# Patient Record
Sex: Female | Born: 1995
Health system: Southern US, Community
[De-identification: ages and names within clinical notes are randomized; demographics above are authoritative.]

## PROBLEM LIST (undated history)

## (undated) VITALS — BP 104/72 | HR 109 | Temp 98.5°F | Resp 16 | Ht 64.17 in | Wt 121.0 lb

## (undated) DIAGNOSIS — F32A Depression, unspecified: Secondary | ICD-10-CM

## (undated) DIAGNOSIS — F419 Anxiety disorder, unspecified: Secondary | ICD-10-CM

## (undated) DIAGNOSIS — F329 Major depressive disorder, single episode, unspecified: Secondary | ICD-10-CM

## (undated) HISTORY — DX: Major depressive disorder, single episode, unspecified: F32.9

## (undated) HISTORY — DX: Depression, unspecified: F32.A

## (undated) HISTORY — DX: Anxiety disorder, unspecified: F41.9

---
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
## 2016-10-20 DIAGNOSIS — L910 Hypertrophic scar: Secondary | ICD-10-CM | POA: Diagnosis not present

## 2016-10-20 DIAGNOSIS — L709 Acne, unspecified: Secondary | ICD-10-CM | POA: Diagnosis not present

## 2017-01-27 DIAGNOSIS — F419 Anxiety disorder, unspecified: Secondary | ICD-10-CM | POA: Diagnosis not present

## 2017-02-08 ENCOUNTER — Encounter (HOSPITAL_COMMUNITY): Payer: Self-pay

## 2017-02-08 ENCOUNTER — Emergency Department (HOSPITAL_COMMUNITY)
Admission: EM | Admit: 2017-02-08 | Discharge: 2017-02-08 | Disposition: A | Discharge status: Home / Self Care | Payer: BLUE CROSS/BLUE SHIELD | Attending: Emergency Medicine | Admitting: Emergency Medicine

## 2017-02-08 DIAGNOSIS — F1120 Opioid dependence, uncomplicated: Secondary | ICD-10-CM | POA: Insufficient documentation

## 2017-02-08 DIAGNOSIS — Z79899 Other long term (current) drug therapy: Secondary | ICD-10-CM | POA: Diagnosis not present

## 2017-02-08 DIAGNOSIS — F331 Major depressive disorder, recurrent, moderate: Secondary | ICD-10-CM | POA: Insufficient documentation

## 2017-02-08 DIAGNOSIS — T43292A Poisoning by other antidepressants, intentional self-harm, initial encounter: Secondary | ICD-10-CM | POA: Diagnosis not present

## 2017-02-08 DIAGNOSIS — T424X2A Poisoning by benzodiazepines, intentional self-harm, initial encounter: Secondary | ICD-10-CM | POA: Diagnosis not present

## 2017-02-08 DIAGNOSIS — T50902A Poisoning by unspecified drugs, medicaments and biological substances, intentional self-harm, initial encounter: Secondary | ICD-10-CM

## 2017-02-08 DIAGNOSIS — T43222A Poisoning by selective serotonin reuptake inhibitors, intentional self-harm, initial encounter: Secondary | ICD-10-CM | POA: Diagnosis not present

## 2017-02-08 DIAGNOSIS — T50992A Poisoning by other drugs, medicaments and biological substances, intentional self-harm, initial encounter: Secondary | ICD-10-CM | POA: Diagnosis not present

## 2017-02-08 LAB — COMPREHENSIVE METABOLIC PANEL
ALK PHOS: 136 U/L — AB (ref 38–126)
ALT: 13 U/L — ABNORMAL LOW (ref 14–54)
ANION GAP: 11 (ref 5–15)
AST: 23 U/L (ref 15–41)
Albumin: 4.6 g/dL (ref 3.5–5.0)
BUN: 15 mg/dL (ref 6–20)
CALCIUM: 10 mg/dL (ref 8.9–10.3)
CO2: 22 mmol/L (ref 22–32)
Chloride: 106 mmol/L (ref 101–111)
Creatinine, Ser: 0.87 mg/dL (ref 0.44–1.00)
GFR calc Af Amer: >60 mL/min (ref >60)
GFR calc non Af Amer: >60 mL/min (ref >60)
Glucose, Bld: 113 mg/dL — ABNORMAL HIGH (ref 65–99)
POTASSIUM: 3.5 mmol/L (ref 3.5–5.1)
Sodium: 139 mmol/L (ref 135–145)
Total Bilirubin: 0.9 mg/dL (ref 0.3–1.2)
Total Protein: 8.2 g/dL — ABNORMAL HIGH (ref 6.5–8.1)

## 2017-02-08 LAB — RAPID URINE DRUG SCREEN, HOSP PERFORMED
Amphetamines: NOT DETECTED
Barbiturates: NOT DETECTED
Benzodiazepines: POSITIVE — AB
COCAINE: NOT DETECTED
Opiates: POSITIVE — AB
Tetrahydrocannabinol: NOT DETECTED

## 2017-02-08 LAB — ETHANOL: Alcohol, Ethyl (B): <5 mg/dL (ref <5)

## 2017-02-08 LAB — CBC
HCT: 43.2 % (ref 36.0–46.0)
Hemoglobin: 14.9 g/dL (ref 12.0–15.0)
MCH: 28.8 pg (ref 26.0–34.0)
MCHC: 34.5 g/dL (ref 30.0–36.0)
MCV: 83.4 fL (ref 78.0–100.0)
PLATELETS: 281 10*3/uL (ref 150–400)
RBC: 5.18 MIL/uL — ABNORMAL HIGH (ref 3.87–5.11)
RDW: 13 % (ref 11.5–15.5)
WBC: 9.9 10*3/uL (ref 4.0–10.5)

## 2017-02-08 LAB — SALICYLATE LEVEL: Salicylate Lvl: <7 mg/dL (ref 2.8–30.0)

## 2017-02-08 LAB — ACETAMINOPHEN LEVEL
Acetaminophen (Tylenol), Serum: <10 ug/mL — ABNORMAL LOW (ref 10–30)
Acetaminophen (Tylenol), Serum: <10 ug/mL — ABNORMAL LOW (ref 10–30)

## 2017-02-08 LAB — I-STAT BETA HCG BLOOD, ED (MC, WL, AP ONLY): I-stat hCG, quantitative: <5 m[IU]/mL (ref <5)

## 2017-02-08 MED ORDER — ONDANSETRON 4 MG PO TBDP: 4.0000 mg | ORAL_TABLET | Freq: Three times a day (TID) | ORAL | Status: DC | PRN

## 2017-02-08 MED ORDER — ONDANSETRON HCL 4 MG/2ML IJ SOLN
4.0000 mg | Freq: Once | INTRAMUSCULAR | Status: AC
  Administered 2017-02-08: 4 mg via INTRAVENOUS
  Filled 2017-02-08: qty 2

## 2017-02-08 MED ORDER — SODIUM CHLORIDE 0.9 % IV BOLUS (SEPSIS)
1000.0000 mL | Freq: Once | INTRAVENOUS | Status: AC
  Administered 2017-02-08: 1000 mL via INTRAVENOUS

## 2017-02-08 NOTE — BH Assessment (Addendum)
Assessment Note  Jamie Cross is an 21 y.o. female that presents this date after ingesting her psychotropic medications in a attempt to harm herself. Patient states she currently resides with her parents (parents are separated so patient splits her time between both of those residences). Patient states she has been receiving services from a OP provider (Deb Young LCSW therapist and medication management from a PA at that provider also). Patient has had one prior admission in 2015 at [facility] for similar symptoms (S/I and anxiety) but was at that time referred to her current provider whom she currently sees on a regular bases. Patient is denying S/I, H/I and AVH at the time of assessment and was very remorseful in reference to the incident that occurred this date. Patient reports ongoing therapy and is complaint with her current medication regimen. Patient stated she has been dealing with multiple stressors to include: stress at school and increased depression with symptoms to include isolating and "feeling overwhelmed." Patient is oriented to time/place and denies any H/I or AVH. Patient denies any history of SA use and is currently a Consulting civil engineer at [employer]. Patient's parents were present during assessment Jamie Cross [id number] and Olena Leatherwood [phone]) and renders collateral information that is consistent with patient's information. Patient is requesting to be discharged but feels she is in need of additional OP services. Patient states she is interested in possibly attending IOP at [facility]. Parents who are present state that they feel patient is safe to be discharged and will monitor patient's behavior and assist with medication management. Patient and family inform this writer that a inpatient admission would be "very stressful" on patient being in unfamiliar surroundings. Per admission notes, patient has a history of depression who presents to the ED following an intentional drug overdose. This  patient states that approximately 2 hours ago she took an unknown quantity of Sertraline, Clonazepam, Prozac, and Lexapro. Patient was offered a inpatient admission but declined this date. Case was staffed with Jannifer Franklin MD and Cresenciano Genre (who also evaluated patient) recommended an inpatient admission to assist with stabilization although MD stated if parent's could assist patient on discharge (monitoring behaviors, continuing to reside with parent/s and medication management) patient could be discharged to home and if patient would follow up with Intensive Out Patient Care at [facility] and continue to see her current provider. This Clinical research associate reviewed a safety plan with patient and parents as patient contracted for safety. [facility] to assist with making an appointment for patient to follow up with possible services (IOP) at [facility].       Diagnosis: MDD recurrent without psychotic features, severe  Past Medical History:  Past Medical History:  Diagnosis Date  . Depression     Past Surgical History:  Procedure Laterality Date  . TONSILLECTOMY      Family History:  Family History  Problem Relation Age of Onset  . Alcohol abuse Paternal Aunt   . Depression Maternal Uncle   . Depression Maternal Grandmother   . Depression Paternal Grandmother   . Bipolar disorder Other   . Suicidality Neg Hx     Social History:  reports that she has never smoked. She has never used smokeless tobacco. She reports that she does not drink alcohol or use drugs.  Additional Social History:  Alcohol / Drug Use Pain Medications: denies Prescriptions: denies Over the Counter: denies History of alcohol / drug use?: No history of alcohol / drug abuse Negative Consequences of Use:  (Denies)  Withdrawal Symptoms:  (Denies)  CIWA: CIWA-Ar BP: (!) 121/94 Pulse Rate: 90 COWS:    Allergies: No Known Allergies  Home Medications:  (Not in a hospital admission)  OB/GYN Status:  No LMP recorded.  General  Assessment Data Location of Assessment: [facility] TTS Assessment: In system Is this a Tele or Face-to-Face Assessment?: Face-to-Face Is this an Initial Assessment or a Re-assessment for this encounter?: Initial Assessment Marital status: Single Maiden name: NA Is patient pregnant?: No Pregnancy Status: No Living Arrangements: Parent Can pt return to current living arrangement?: Yes Admission Status: Voluntary Is patient capable of signing voluntary admission?: Yes Referral Source: Self/Family/Friend Insurance type: Tax adviserBC/BS  Medical Screening Exam [facility] Walk-in ONLY) Medical Exam completed: Yes  Crisis Care Plan Living Arrangements: Parent Legal Guardian:  (NA) Name of Psychiatrist: Lyda Jesterurtis NP Name of Therapist: [name]  Education Status Is patient currently in school?: Yes Current Grade:  ([employer] Second Year) Highest grade of school patient has completed:  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 : No Suicidal Intent: Yes-Currently Present (pt stated no on admission, pt is vague) Has patient had any suicidal intent within the past 6 months prior to admission? : No Is patient at risk for suicide?: Yes Suicidal Plan?: Yes-Currently Present (Yes pt attempted to overdose) Has patient had any suicidal plan within the past 6 months prior to admission? : No Specify Current Suicidal Plan: Pt attempted to overdose Access to Means: Yes Specify Access to Suicidal Means: pt has medications What has been your use of drugs/alcohol within the last 12 months?: Denies Previous Attempts/Gestures: Yes How many times?: 1 Other Self Harm Risks: NA Triggers for Past Attempts: Other (Comment) (Stress at school) Intentional Self Injurious Behavior: None Depression Symptoms:  Insomnia, Isolating Substance abuse history and/or treatment for substance abuse?: Yes Suicide prevention information given to non-admitted patients: Not applicable  Risk to Others within the past 6 months Homicidal Ideation: No-Not Currently/Within Last 6 Months Does patient have any lifetime risk of violence toward others beyond the six months prior to admission? : No Thoughts of Harm to Others: No Current Homicidal Intent: No Current Homicidal Plan: No Access to Homicidal Means: No Identified Victim: NA History of harm to others?: No Assessment of Violence: None Noted Violent Behavior Description: NA Does patient have access to weapons?: No Criminal Charges Pending?: No Does patient have a court date: No Is patient on probation?: No  Psychosis Hallucinations: None noted Delusions: None noted  Mental Status Report Appearance/Hygiene: In hospital gown Eye Contact: Fair Motor Activity: Freedom of movement Speech: Logical/coherent Level of Consciousness: Alert Mood: Pleasant Affect: Appropriate to circumstance Anxiety Level: Minimal Thought Processes: Coherent, Relevant Judgement: Unimpaired Orientation: Person, Place, Time Obsessive Compulsive Thoughts/Behaviors: None  Cognitive Functioning Concentration: Normal Memory: Recent Intact, Remote Intact IQ: Average Insight: Fair Impulse Control: Fair Appetite: Good Weight Loss: 0 Weight Gain: 0 Sleep: No Change Total Hours of Sleep: 6 Vegetative Symptoms: None  ADLScreening [facility] Assessment Services) Patient's cognitive ability adequate to safely complete daily activities?: Yes Patient able to express need for assistance with ADLs?: Yes Independently performs ADLs?: Yes (appropriate for developmental age)  Prior Inpatient Therapy Prior Inpatient Therapy: Yes Prior Therapy Dates: 2015 [facility] Reason for Treatment: S/I  Prior Outpatient  Therapy Prior Outpatient Therapy: Yes Prior  Therapy Dates: 2018 Prior Therapy Facilty/Provider(s): Deb Young LCSW Reason for Treatment: MH issues Does patient have an ACCT team?: No Does patient have Intensive In-House Services?  : No Does patient have [facility] services? : No Does patient have P4CC services?: No  ADL Screening (condition at time of admission) Patient's cognitive ability adequate to safely complete daily activities?: Yes Is the patient deaf or have difficulty hearing?: No Does the patient have difficulty seeing, even when wearing glasses/contacts?: No Does the patient have difficulty concentrating, remembering, or making decisions?: No Patient able to express need for assistance with ADLs?: Yes Does the patient have difficulty dressing or bathing?: No Independently performs ADLs?: Yes (appropriate for developmental age) Does the patient have difficulty walking or climbing stairs?: No Weakness of Legs: None Weakness of Arms/Hands: None  Home Assistive Devices/Equipment Home Assistive Devices/Equipment: None  Therapy Consults (therapy consults require a physician order) PT Evaluation Needed: No OT Evalulation Needed: No SLP Evaluation Needed: No Abuse/Neglect Assessment (Assessment to be complete while patient is alone) Physical Abuse: Denies Verbal Abuse: Denies Sexual Abuse: Denies Exploitation of patient/patient's resources: Denies Self-Neglect: Denies Values / Beliefs Cultural Requests During Hospitalization: None Spiritual Requests During Hospitalization: None Consults Spiritual Care Consult Needed: No Social Work Consult Needed: No Merchant navy officer (For Healthcare) Does Patient Have a Medical Advance Directive?: No Would patient like information on creating a medical advance directive?: No - Patient declined    Additional Information 1:1 In Past 12 Months?: No CIRT Risk: No Elopement Risk: No Does patient have medical clearance?: Yes     Disposition: Case was staffed with Akintayo MD and  Cresenciano Genre (who also evaluated patient) recommended an inpatient admission to assist with stabilization although MD stated if parent's could assist patient on discharge (monitoring behaviors, continuing to reside with parent/s and medication management) patient could be discharged to home and if patient would follow up with Intensive Out Patient Care at [facility] and continue to see her current provider. This Clinical research associate reviewed a safety plan with patient and parents as patient contracted for safety. [facility] staff [facility] contact [facility] to assist with making an appointment for patient to follow up with possible services (IOP) at [facility].       Disposition Initial Assessment Completed for this Encounter: Yes Disposition of Patient: Outpatient treatment Type of outpatient treatment: Adult (pt will be referred to [facility] OP)  On Site Evaluation by:   Reviewed with Physician:    Alfredia Ferguson 02/08/2017 10:15 AM

## 2017-02-08 NOTE — Discharge Instructions (Signed)
For your ongoing behavioral health needs, you are advised to follow up with the Mental Health Intensive Outpatient Program at [facility] [place].  You are scheduled to start the program on Thursday, Feb 11, 2017 at 8:45 am.  Jeri Modenaita Clark, [place], will be calling you some time before your start date to answer any questions that you may have, and to obtain additional information.  You may also reach her at the phone number indicated below:       [facility]      [address]      [place], [place] [address]      Contact person: Jeri ModenaRita Clark, MEd      [phone]

## 2017-02-08 NOTE — ED Notes (Addendum)
Introduced self to patient/family. Pt oriented to unit expectations.  Assessed pt for:  A) Anxiety &/or agitation: On admission to the [facility] pt was calm, cooperative and appropriate. She affirms that she does not feel suicidal now and regrets her actions last night. She said that it was a momentary action and she does not want to kill herself. For that past month pt has been having an increase in depression. She reports being sensitive to side effects of medications. Prozac 10 mg helped her depression and mood, but made her feel drowsy. The dose was changed to 5 mg to take at night and it was then not therapeutic for depression. She stopped taking it. She would like to try Prozac again, and work through the side effects. Her parents were both here and supportive to her. Mother and father both want for her to be released and for her to go to Intensive Outpatient therapy and management of her symptoms. Her mother and father pledge that they will see to it that pt takes any medications prescribed, that she does not have any guns in her home and they will see to it that pt keeps her appointments with outpatient services. Appointment has been made for her IOP and pt is aware as it is in her discharge instructions.   S) Safety: Safety maintained with q-15-minute checks and hourly rounds by staff.  A) ADLs: Pt able to perform ADLs independently.  P) Pick-Up (room cleanliness): Pt's room clean and free of clutter.

## 2017-02-08 NOTE — ED Notes (Signed)
Poison control called to evaluate pt status post ingestion and was advised of results from blood and EKG. Poison control advised that they would be closing out pt case at this time. Dr.Wickline notified.

## 2017-02-08 NOTE — ED Provider Notes (Signed)
[facility]-EMERGENCY DEPT Provider Note   CSN: [id number] Arrival date & time: 02/08/17  0200  By signing my name below, I, Elder NegusRussell Johnston, attest that this documentation has been prepared under the direction and in the presence of Zadie RhineWickline, Howell Groesbeck, MD. Electronically Signed: Elder Negusussell Johnston, Scribe. 02/08/17. 2:19 AM.   History   Chief Complaint Chief Complaint  Patient presents with  . Drug Overdose  . Suicidal    HPI Jamie Cross is a 21 y.o. female with history of depression who presents to the ED following an intentional drug overdose. This patient states that approximately 2 hours ago she took an unknown quantity of sertraline, Clonazepam, Prozac, and Lexapro. Since that time she has been actively vomiting. At interview, she states that the sertraline bottle was "mostly full", the lexapro "was almost empty", and the rest were "half full". She is otherwise reporting mild abdominal discomfort she attributes to vomiting. No changes from her baseline mental status according to family members who are present.  The history is provided by the patient. No language interpreter was used.  Drug Overdose  This is a new problem. The current episode started 1 to 2 hours ago. The problem occurs rarely. The problem has not changed since onset.Associated symptoms include abdominal pain. Pertinent negatives include no chest pain. Nothing aggravates the symptoms.    Past Medical History:  Diagnosis Date  . Depression     Patient Active Problem List   Diagnosis Date Noted  . Major depression, recurrent (HCC) 04/07/2014  . Depression, major, recurrent (HCC) 04/07/2014    Past Surgical History:  Procedure Laterality Date  . TONSILLECTOMY      OB History    No data available       Home Medications    Prior to Admission medications   Medication Sig Start Date End Date Taking? Authorizing Provider  B Complex-C (B-COMPLEX WITH VITAMIN C) tablet Take 1 tablet by mouth.    [provider]  cholecalciferol (VITAMIN D) 1000 UNITS tablet Take 5,000 Units by mouth daily.    [provider]  escitalopram (LEXAPRO) 20 MG tablet Take 1 tablet (20 mg total) by mouth daily. 08/23/14   Oletta DarterAgarwal, [place], MD    Family History Family History  Problem Relation Age of Onset  . Alcohol abuse Paternal Aunt   . Depression Maternal Uncle   . Depression Maternal Grandmother   . Depression Paternal Grandmother   . Bipolar disorder Other   . Suicidality Neg Hx     Social History Social History  Substance Use Topics  . Smoking status: Never Smoker  . Smokeless tobacco: Never Used  . Alcohol use No     Allergies   Patient has no known allergies.   Review of Systems Review of Systems  Cardiovascular: Negative for chest pain.  Gastrointestinal: Positive for abdominal pain, nausea and vomiting.  Neurological: Negative for syncope, weakness and light-headedness.  Psychiatric/Behavioral: Positive for suicidal ideas.  All other systems reviewed and are negative.    Physical Exam Updated Vital Signs BP (!) 160/116 (BP Location: Left Arm)   Pulse 87   Temp 98.1 F (36.7 C) (Oral)   Resp 16   Ht 5\' 6"  (1.676 m)   Wt 127 lb (57.6 kg)   SpO2 100%   BMI 20.50 kg/m   Physical Exam CONSTITUTIONAL: Well developed/well nourished, patient actively vomiting HEAD: Normocephalic/atraumatic EYES: EOMI/PERRL ENMT: Mucous membranes moist, uvula midline, no erythema noted, no stridor noted NECK: supple no meningeal signs SPINE/BACK:entire spine  nontender CV: S1/S2 noted, no murmurs/rubs/gallops noted LUNGS: Lungs are clear to auscultation bilaterally, no apparent distress ABDOMEN: soft, nontender GU:no cva tenderness NEURO: Pt is awake/alert/appropriate, moves all extremitiesx4.  No facial droop.   EXTREMITIES: pulses normal/equal, full ROM SKIN: warm, color normal PSYCH: flat affect noted.  ED Treatments / Results  Labs (all labs ordered are listed, but  only abnormal results are displayed) Labs Reviewed  COMPREHENSIVE METABOLIC PANEL - Abnormal; Notable for the following:       Result Value   Glucose, Bld 113 (*)    Total Protein 8.2 (*)    ALT 13 (*)    Alkaline Phosphatase 136 (*)    All other components within normal limits  ACETAMINOPHEN LEVEL - Abnormal; Notable for the following:    Acetaminophen (Tylenol), Serum <10 (*)    All other components within normal limits  CBC - Abnormal; Notable for the following:    RBC 5.18 (*)    All other components within normal limits  RAPID URINE DRUG SCREEN, HOSP PERFORMED - Abnormal; Notable for the following:    Opiates POSITIVE (*)    Benzodiazepines POSITIVE (*)    All other components within normal limits  ACETAMINOPHEN LEVEL - Abnormal; Notable for the following:    Acetaminophen (Tylenol), Serum <10 (*)    All other components within normal limits  ETHANOL  SALICYLATE LEVEL  I-STAT BETA HCG BLOOD, ED ([facility], [facility], [facility] ONLY)    EKG  EKG Interpretation  Date/Time:  Monday Feb 08 2017 02:22:03 EDT Ventricular Rate:  82 PR Interval:    QRS Duration: 79 QT Interval:  371 QTC Calculation: 434 R Axis:   56 Text Interpretation:  Sinus rhythm Right atrial enlargement No previous ECGs available Confirmed by Zadie Rhine ([id number]) on 02/08/2017 2:33:37 AM       EKG Interpretation  Date/Time:  Monday Feb 08 2017 06:17:04 EDT Ventricular Rate:  79 PR Interval:    QRS Duration: 83 QT Interval:  381 QTC Calculation: 437 R Axis:   61 Text Interpretation:  Sinus rhythm No significant change since last tracing Confirmed by Zadie Rhine ([id number]) on 02/08/2017 6:36:09 AM       Radiology No results found.  Procedures Procedures    Medications Ordered in ED Medications  sodium chloride 0.9 % bolus 1,000 mL (0 mLs Intravenous Stopped 02/08/17 0412)  ondansetron (ZOFRAN) injection 4 mg (4 mg Intravenous Given 02/08/17 0310)  ondansetron (ZOFRAN) injection 4 mg (4 mg Intravenous  Given 02/08/17 0528)     Initial Impression / Assessment and Plan / ED Course  I have reviewed the triage vital signs and the nursing notes.  Pertinent labs   results that were available during my care of the patient were reviewed by me and considered in my medical decision making (see chart for details).     3:13 AM Pt awake/alert, in the ED after intentional suicide attempt/overdose of unknown quantity of multiple meds She is awake/alert EKG unremarkable Per poison center recommendations, will monitor for 6 hours If no acute changes, will be cleared for psychiatry 6:39 AM Pt stable in the ED She is awake/alert, ambulatory No EKG changes Labs reassuring Pt medically stable Will consult psych She has been cleared by poison center  Final Clinical Impressions(s) / ED Diagnoses   Final diagnoses:  Intentional drug overdose, initial encounter [facility])  Polysubstance overdose, intentional self-harm, initial encounter [facility])    New Prescriptions New Prescriptions   No medications on file  I personally performed  the services described in this documentation, which was scribed in my presence. The recorded information has been reviewed and is accurate.       Zadie Rhine, MD 02/08/17 [phone]

## 2017-02-08 NOTE — BH Assessment (Signed)
[facility] Assessment Progress Note  Per Thedore MinsMojeed Akintayo, MD, this pt does not require psychiatric hospitalization at this time.  [facility] the Mental Health Intensive Out[facility] Program at [facility] [place].  Pt is scheduled to start the program on Thursday, 02/11/2017 at 08:45.  This has been included in pt's discharge instructions.  Pt's nurse, Diane, has been notified.  She has also been provided with printed information about the program to forward to the pt upon discharge.  Doylene Canninghomas Avyaan Summer, MA Triage Specialist [phone]

## 2017-02-08 NOTE — ED Notes (Signed)
Pt discharged home. Discharged instructions read to pt who verbalized understanding.  Denies SI/HI, is not delusional and not responding to internal stimuli. Escorted pt to the ED exit. Pt's parents had previously taken all of her belongings home. Her father transported her to his house.

## 2017-02-08 NOTE — ED Triage Notes (Signed)
About 0015 ingested lexapro unknown quality sertitraline unknown quality clonazapam unknown quality prozac unknown quality alert and oriented x 3 on arrival states SI

## 2017-02-08 NOTE — ED Notes (Signed)
Poison control contacted and advised that pt had ingested 120mg  Lexapro and they advised to only do a 6hr observation due to the amount being less than 300mg . Dr.Wickline notified of recommendations.

## 2017-02-08 NOTE — [facility] Suicide Risk Assessment (Signed)
Suicide Risk Assessment  Discharge Assessment   [facility]   Principal Problem: Major depressive disorder, recurrent episode, moderate (HCC) Discharge Diagnoses:  Patient Active Problem List   Diagnosis Date Noted  . Major depressive disorder, recurrent episode, moderate (HCC) [F33.1] 02/08/2017    Priority: High    Total Time spent with patient: 45 minutes  Musculoskeletal: Strength & Muscle Tone: within normal limits Gait & Station: normal Patient leans: N/A  Psychiatric Specialty Exam:   Blood pressure (!) 121/94, pulse 90, temperature 98.1 F (36.7 C), temperature source Oral, resp. rate 16, height 5\' 6"  (1.676 m), weight 57.6 kg (127 lb), SpO2 100 %.Body mass index is 20.5 kg/m.  General Appearance: Casual  Eye Contact::  Good  Speech:  Normal Rate409  Volume:  Normal  Mood:  Depressed  Affect:  Congruent  Thought Process:  Coherent and Descriptions of Associations: Intact  Orientation:  Full (Time, Place, and Person)  Thought Content:  WDL and Logical  Suicidal Thoughts:  No  Homicidal Thoughts:  No  Memory:  Immediate;   Good Recent;   Good Remote;   Good  Judgement:  Fair  Insight:  Good  Psychomotor Activity:  Normal  Concentration:  Good  Recall:  Good  Fund of Knowledge:Good  Language: Good  Akathisia:  No  Handed:  Right  AIMS (if indicated):     Assets:  Communication Skills Desire for Improvement Financial Resources/Insurance Housing Leisure Time Physical Health Resilience Social Support Talents/Skills Transportation Vocational/Educational  Sleep:     Cognition: WNL  ADL's:  Intact   Mental Status Per Nursing Assessment::   On Admission:   overdose.  She regrets her actions this morning.  Reports she has been depressed more this past month.  She is vested in her treatment with her therapist and PA.  Lelon MastSamantha reports being sensitive to medications and was recently had her Lexapro increased but it made her sleepy  and stopped the increase.  She is now willing to be sleepy for the benefits.  Lelon MastSamantha moved in with her parents since she is finished with this semester at school.  Inpatient psychiatric care was recommended but she and her parents do not want this.  They are in agreement to intensive outpatient for her care.  Lelon MastSamantha will share time living between her mother and father who will now control her medications, safety plan in place.  No suicidal/homicidal ideations, hallucinations, or alcohol/drug abuse.  Parents are agreeable to take over her care.    Demographic Factors:  Adolescent or young adult and Caucasian  Loss Factors: NA  Historical Factors: NA  Risk Reduction Factors:   Sense of responsibility to family, Living with another person, especially a relative, Positive social support and Positive therapeutic relationship  Continued Clinical Symptoms:  Depression   Cognitive Features That Contribute To Risk:  None    Suicide Risk:  Minimal: No identifiable suicidal ideation.  Patients presenting with no risk factors but with morbid ruminations; may be classified as minimal risk based on the severity of the depressive symptoms    Plan Of Care/Follow-up recommendations:  Activity:  as tolerated Diet:  heart healthy diet  Ceola Para, NP 02/08/2017, 10:31 AM

## 2017-02-08 NOTE — Progress Notes (Signed)
Patient took an overdose prior to admission.  She regrets her actions this morning.  Reports she has been depressed more this past month.  She is vested in her treatment with her therapist and PA.  She is taking time to heal herself this summer. Jamie Cross reports being sensitive to medications and recently had her Lexapro increased but it made her sleepy and stopped the increase.  She is now willing to be sleepy for the benefits.  Jamie Cross moved in with her parents since she is finished with this semester at school.  Inpatient psychiatric care was recommended but she and her parents do not want this.  They are in [facility].  Jamie Cross will share time living between her mother and father who will now control her medications, safety plan in place, no guns in the homes.  No suicidal/homicidal ideations, hallucinations, or alcohol/drug abuse.  Parents are agreeable to take over her care at this time.    Jamie Cross, PMHNP

## 2017-02-08 NOTE — BH Assessment (Signed)
[facility] Assessment Progress Note Case was staffed with Akintayo MD and Cresenciano GenreLord DNP (who also evaluated patient) recommended an inpatient admission to assist with stabilization although MD stated if parent's could assist patient on discharge (monitoring behaviors, continuing to reside with parent/s and medication management) patient could be discharged to home and if patient would follow up with [facility] and continue to see her current provider. This Clinical research associatewriter reviewed a safety plan with patient and parents as patient contracted for safety. [facility] staff will [facility] to assist with making an appointment for patient to follow up with possible services [facility].

## 2017-02-08 NOTE — ED Notes (Signed)
Poison Control contacted and advised 24 cardiac observation to monitor for seizures and QTC prolongation due to Lexapro. EKG every 6-8 hr. If QTC >500 optimize electrolytes and magnesium. Monitor for CNS depression, tachycardia, GI symptoms, HTN, seizures. Evaluate QTC before giving antiemetics. Dr.Wickine made aware of recommendations.

## 2017-02-11 ENCOUNTER — Encounter (HOSPITAL_COMMUNITY): Payer: Self-pay | Admitting: Psychiatry

## 2017-02-11 ENCOUNTER — Other Ambulatory Visit (HOSPITAL_COMMUNITY): Payer: BLUE CROSS/BLUE SHIELD | Attending: Psychiatry | Admitting: Psychiatry

## 2017-02-11 DIAGNOSIS — Z79899 Other long term (current) drug therapy: Secondary | ICD-10-CM | POA: Insufficient documentation

## 2017-02-11 DIAGNOSIS — F332 Major depressive disorder, recurrent severe without psychotic features: Secondary | ICD-10-CM | POA: Diagnosis not present

## 2017-02-11 DIAGNOSIS — F411 Generalized anxiety disorder: Secondary | ICD-10-CM | POA: Diagnosis not present

## 2017-02-11 NOTE — Progress Notes (Signed)
Psychiatric Initial Adult Assessment   Patient Identification: Jamie Cross MRN:  [id number] Date of Evaluation:  02/11/2017 Referral Source: Bradley Ferris her therapist Chief Complaint:   Chief Complaint    Depression; Anxiety; Stress     Visit Diagnosis:    ICD-9-CM ICD-10-CM   1. Severe recurrent major depression without psychotic features (HCC) 296.33 F33.2   2. Generalized anxiety disorder 300.02 F41.1     History of Present Illness:  Ms Stotz has had anxiety and depression since middle school.  Anxiety is fairly constant with occasional panic attacks and the depression comes and goes without precipitants.  The current episode started months ago and has worsened to the point of taking an overdose.  Was not planned she said but was in response to another night of not being able to get to sleep.  She has tried all the [employer] and SNRI's and buproprion without help.  Currently on fluoxetine 5 mg as the 10 mg caused side effects.  She is overly sensitive to all antidepressants particularly the seritonin effects.  Therapy has been helpful but not enough to get rid of the depression.she was not admitted after the overdose but sent to IOP.  She was admitted inpatient 2 or 3 years ago and said she could handle her depression best without another hospitalization.  No particular stressors.  She was in college in [place] and decided that was not where she wanted to be and transferred to [employer] here which she likes much better but the depression had progressed to the point that it only got worse.  She feels no energy, no motivation , decreased interest in friends and activities, some hopelessness, poor focus, loss of pleasure and sadness.  No  Longer suicidal.  Supportive parents.good childhood except for bullying.  No OCD.  Some body part obsessions.  No history of abuse  Associated Signs/Symptoms: Depression Symptoms:  depressed mood, anhedonia, insomnia, hypersomnia, fatigue, difficulty  concentrating, hopelessness, suicidal attempt, anxiety, panic attacks, loss of energy/fatigue, (Hypo) Manic Symptoms:  Irritable Mood, Anxiety Symptoms:  Excessive Worry, Psychotic Symptoms:  none PTSD Symptoms: Negative  Past Psychiatric History: in ongoing therapy for years with multiple med trials and one inpatient stay  Previous Psychotropic Medications: Yes   Substance Abuse History in the last 12 months:  No.  Consequences of Substance Abuse: Negative  Past Medical History:  Past Medical History:  Diagnosis Date  . Anxiety   . Depression     Past Surgical History:  Procedure Laterality Date  . TONSILLECTOMY      Family Psychiatric History: depression and anxiety on maternal side  Family History:  Family History  Problem Relation Age of Onset  . Alcohol abuse Paternal Aunt   . Depression Maternal Uncle   . Depression Maternal Grandmother   . Anxiety disorder Maternal Grandmother   . Depression Paternal Grandmother   . Bipolar disorder Other   . Depression Mother   . Anxiety disorder Sister   . Suicidality Neg Hx     Social History:   Social History   Social History  . Marital status: Single    Spouse name: N/A  . Number of children: N/A  . Years of education: N/A   Social History Main Topics  . Smoking status: Never Smoker  . Smokeless tobacco: Never Used  . Alcohol use No  . Drug use: No  . Sexual activity: Not Asked   Other Topics Concern  . None   Social History Narrative  . None  Additional Social History: outgoing with friends and good academics  Allergies:  No Known Allergies  Metabolic Disorder Labs: No results found for: HGBA1C, MPG No results found for: PROLACTIN Lab Results  Component Value Date   CHOL 118 04/08/2014   TRIG 91 04/08/2014   HDL 26 (L) 04/08/2014   CHOLHDL 4.5 04/08/2014   VLDL 18 04/08/2014   LDLCALC 74 04/08/2014     Current Medications: Current Outpatient Prescriptions  Medication Sig  Dispense Refill  . clonazePAM (KLONOPIN) 0.5 MG tablet Take 0.5 mg by mouth 2 (two) times daily as needed for anxiety.   0  . FLUoxetine (PROZAC) 10 MG tablet Take 10 mg by mouth daily.  0   No current facility-administered medications for this visit.     Neurologic: Headache: Negative Seizure: Negative Paresthesias:Negative  Musculoskeletal: Strength & Muscle Tone: within normal limits Gait & Station: normal Patient leans: N/A  Psychiatric Specialty Exam: ROS  There were no vitals taken for this visit.There is no height or weight on file to calculate BMI.  General Appearance: Well Groomed  Eye Contact:  Good  Speech:  Clear and Coherent  Volume:  Normal  Mood:  Anxious and Depressed  Affect:  Congruent  Thought Process:  Coherent and Goal Directed  Orientation:  Full (Time, Place, and Person)  Thought Content:  Logical  Suicidal Thoughts:  No  Homicidal Thoughts:  No  Memory:  Immediate;   Good Recent;   Good Remote;   Good  Judgement:  Good  Insight:  Good  Psychomotor Activity:  Normal  Concentration:  Concentration: Good and Attention Span: Good  Recall:  Good  Fund of Knowledge:Good  Language: Good  Akathisia:  Negative  Handed:  Right  AIMS (if indicated):  0  Assets:  Communication Skills Desire for Improvement Financial Resources/Insurance Housing Leisure Time Physical Health Resilience Social Support Talents/Skills Transportation Vocational/Educational  ADL's:  Intact  Cognition: WNL  Sleep:  poor    Treatment Plan Summary: Admit to IOP with daily group therapy.  Continue fluoxetine at 5 mg until she is ready to re-try 10 mg.  She does not want to try anything for sleep because of her sensitivity to meds.  Discussed TMS which is a reasonable next option, ECT and ketamine which she does not want to try due to her sensitivity to medication.   Carolanne GrumblingGerald Tomy Khim, MD 5/24/201811:34 AM

## 2017-02-11 NOTE — Progress Notes (Signed)
Comprehensive Clinical Assessment (CCA) Note  02/11/2017 Jamie Cross [id number]  Visit Diagnosis:      ICD-9-CM ICD-10-CM   1. Severe recurrent major depression without psychotic features (HCC) 296.33 F33.2   2. Generalized anxiety disorder 300.02 F41.1       CCA Part One  Part One has been completed on paper by the patient.  (See scanned document in Chart Review)  CCA Part Two A  Intake/Chief Complaint:  CCA Intake With Chief Complaint CCA Part Two Date: 02/11/17 CCA Part Two Time: 1202 Chief Complaint/Presenting Problem: This is a 20 single, unemployed, Caucasian female; who was referred per TTS; treatment for worsening depressive and anxiety symptoms.  Pt recently had a OD.  "I was just tired of going to bed every night and not being able to sleep.  I didn't want to die, I just wanted some sleep."  According to pt she has struggled with depression since middle school, but the episodes have been getting longer since her junior yr in high school.  Triggers/Stressors:  1)  School ([employer]).  Pt transferred there due to depression.  Reports she had a lot on her plate (ie. full time student, clubs, was working).  Mentioned there was a bad roommate situation which occurred.  Pt ended up quitting the job.  States the customers were verbally abusive.  "My parents (who are divorced) are supportive, but without a job I don't have any money on my own."  2)  Body Self Image Issues.  States this stems from being bullied in school when she was younger.  Reports one prior psych hospitalization in 2015 [facility] due to depression.  No prior suicide attempts except for the one recently.  Has been seeing [name], LCAS on and off since middle school.  Hx of seeing Dr. [name], requesting to start back with her.  Family Hx:  Maternal GM (Depression & Anxiety), Mother (Depression and Anxiety), Sister (Anxiety and Hx of drugs), Father (Recovering alcoholic)                                                        Patients Currently Reported Symptoms/Problems: Sadness, ruminating thoughts, anhedonia, poor energy, no motivation, hx of panic attacks, decreased appetite, poor sleep Collateral Involvement: Parents are supportive. Individual's Strengths: Pt is motivated. Type of Services Patient Feels Are Needed: MH-IOP  Mental Health Symptoms Depression:  Depression: Change in energy/activity, Difficulty Concentrating, Increase/decrease in appetite, Irritability, Sleep (too much or little)  Mania:  Mania: N/A  Anxiety:   Anxiety: Worrying  Psychosis:  Psychosis: N/A  Trauma:  Trauma: N/A  Obsessions:  Obsessions: Cause anxiety, Intrusive/time consuming  Compulsions:  Compulsions: N/A  Inattention:  Inattention: N/A  Hyperactivity/Impulsivity:  Hyperactivity/Impulsivity: N/A  Oppositional/Defiant Behaviors:  Oppositional/Defiant Behaviors: N/A  Borderline Personality:  Emotional Irregularity: N/A  Other Mood/Personality Symptoms:      Mental Status Exam Appearance and self-care  Stature:  Stature: Average  Weight:  Weight: Average weight  Clothing:  Clothing: Casual  Grooming:  Grooming: Normal  Cosmetic use:  Cosmetic Use: None  Posture/gait:  Posture/Gait: Normal  Motor activity:  Motor Activity: Not Remarkable  Sensorium  Attention:  Attention: Normal  Concentration:  Concentration: Normal  Orientation:  Orientation: X5  Recall/memory:  Recall/Memory: Normal  Affect and Mood  Affect:  Affect: Appropriate  Mood:  Mood: Depressed  Relating  Eye contact:  Eye Contact: Normal  Facial expression:  Facial Expression: Sad  Attitude toward examiner:  Attitude Toward Examiner: Cooperative  Thought and Language  Speech flow: Speech Flow: Normal  Thought content:  Thought Content: Appropriate to mood and circumstances  Preoccupation:     Hallucinations:     Organization:     Company secretary of Knowledge:  Fund of Knowledge: Average  Intelligence:  Intelligence: Average   Abstraction:  Abstraction: Normal  Judgement:  Judgement: Fair  Dance movement psychotherapist:  Reality Testing: Distorted  Insight:  Insight: Good  Decision Making:  Decision Making: Impulsive  Social Functioning  Social Maturity:  Social Maturity: Isolates  Social Judgement:  Social Judgement: Normal  Stress  Stressors:  Stressors: Work, Environmental health practitioner Ability:  Coping Ability: Building surveyor Deficits:     Supports:      Family and Psychosocial History: Family history Marital status: Single Does patient have children?: No  Childhood History:  Childhood History By whom was/is the patient raised?: Both parents Additional childhood history information: Born in [place], [place].  Father was an alcoholic.  "He had anger issues."  Reports parents were preoccupied with pt's older sister who had drug issues.  States parents got divorced when she was younger.  Denies any abuse.  Pt states she was bullied in school a lot re: her appearance.  "They would tease me about my nose, etc."                                                    Patient's description of current relationship with people who raised him/her: Closer to parents now since they are divorced. Does patient have siblings?: Yes Number of Siblings: 1 Description of patient's current relationship with siblings: Very close to sister now since she is in recovery. Did patient suffer any verbal/emotional/physical/sexual abuse as a child?: No Did patient suffer from severe childhood neglect?: No Has patient ever been sexually abused/assaulted/raped as an adolescent or adult?: No Was the patient ever a victim of a crime or a disaster?: No Witnessed domestic violence?: No Has patient been effected by domestic violence as an adult?: No  CCA Part Two B  Employment/Work Situation: Employment / Work Psychologist, occupational Employment situation: Surveyor, minerals job has been impacted by current illness: No Has patient ever been in the [employer]?: No Has  patient ever served in combat?: No Did You Receive Any Psychiatric Treatment/Services While in Equities trader?: No Are There Guns or Other Weapons in Your Home?: No  Education: Education Did Garment/textile technologist From [employer]?: Yes Did Theme park manager?: Yes What Type of College Degree Do you Have?: Currently still in college Chemical engineer) Did You Attend Graduate School?: No What Was Your Major?: Media/Film Did You Have Any Special Interests In School?: Film Did You Have An Individualized Education Program (IIEP): No Did You Have Any Difficulty At School?: Yes (Was bullied in middle and high school) Were Any Medications Ever Prescribed For These Difficulties?: No  Religion: Religion/Spirituality Are You A Religious Person?: No  Leisure/Recreation: Leisure / Recreation Leisure and Hobbies: Pt participates in theater as an Nurse, children's, and loves outdoors and hanging with friends  Exercise/Diet: Exercise/Diet Do You Exercise?: Yes What Type of Exercise Do You Do?: Run/Walk How Many Times a Week Do You  Exercise?: 1-3 times a week Have You Gained or Lost A Significant Amount of Weight in the Past Six Months?: No Do You Follow a Special Diet?: No Do You Have Any Trouble Sleeping?: Yes Explanation of Sleeping Difficulties: difficulty staying asleep  CCA Part Two C  Alcohol/Drug Use: Alcohol / Drug Use Pain Medications: denies Prescriptions: denies Over the Counter: denies History of alcohol / drug use?: No history of alcohol / drug abuse Longest period of sobriety (when/how long): denies                      CCA Part Three  ASAM's:  Six Dimensions of Multidimensional Assessment  Dimension 1:  Acute Intoxication and/or Withdrawal Potential:     Dimension 2:  Biomedical Conditions and Complications:     Dimension 3:  Emotional, Behavioral, or Cognitive Conditions and Complications:     Dimension 4:  Readiness to Change:     Dimension 5:  Relapse, Continued use,  or Continued Problem Potential:     Dimension 6:  Recovery/Living Environment:      Substance use Disorder (SUD)    Social Function:  Social Functioning Social Maturity: Isolates Social Judgement: Normal  Stress:  Stress Stressors: Work, Veterinary surgeon Coping Ability: Overwhelmed Patient Takes Medications The Way The Doctor Instructed?: Yes Priority Risk: Moderate Risk  Risk Assessment- Self-Harm Potential: Risk Assessment For Self-Harm Potential Thoughts of Self-Harm: No current thoughts Method: No plan Availability of Means: No access/NA Additional Information for Self-Harm Potential: Previous Attempts Additional Comments for Self-Harm Potential: Discussed safety options at length with pt; pt currently denies SI.  Receptive to safety plan if needed.  Risk Assessment -Dangerous to Others Potential: Risk Assessment For Dangerous to Others Potential Method: No Plan Availability of Means: No access or NA Intent: Vague intent or NA Notification Required: No need or identified person  DSM5 Diagnoses: Patient Active Problem List   Diagnosis Date Noted  . Severe recurrent major depression without psychotic features (HCC) 02/11/2017    Class: Chronic  . Generalized anxiety disorder 02/11/2017    Class: Chronic  . Major depressive disorder, recurrent episode, moderate (HCC) 02/08/2017    Patient Centered Plan: Patient is on the following Treatment Plan(s):  Anxiety, Depression and Low Self-Esteem  Recommendations for Services/Supports/Treatments: Recommendations for Services/Supports/Treatments Recommendations For Services/Supports/Treatments: IOP (Intensive Outpatient Program)  Treatment Plan Summary:  Oriented pt to MH-IOP.  Provided pt with an orientation folder.  Informed Bradley Ferris, LCAS of admit.  Encouraged support groups.  Will refer pt to Dr. Michae Kava.  Referrals to Alternative Service(s): Referred to Alternative Service(s):   Place:   Date:   Time:    Referred to  Alternative Service(s):   Place:   Date:   Time:    Referred to Alternative Service(s):   Place:   Date:   Time:    Referred to Alternative Service(s):   Place:   Date:   Time:     CLARK, RITA, M.Ed, CNA

## 2017-02-12 ENCOUNTER — Other Ambulatory Visit (HOSPITAL_COMMUNITY): Payer: BLUE CROSS/BLUE SHIELD | Admitting: Psychiatry

## 2017-02-12 DIAGNOSIS — F332 Major depressive disorder, recurrent severe without psychotic features: Secondary | ICD-10-CM | POA: Diagnosis not present

## 2017-02-12 DIAGNOSIS — Z79899 Other long term (current) drug therapy: Secondary | ICD-10-CM | POA: Diagnosis not present

## 2017-02-12 DIAGNOSIS — F411 Generalized anxiety disorder: Secondary | ICD-10-CM | POA: Diagnosis not present

## 2017-02-16 ENCOUNTER — Other Ambulatory Visit (HOSPITAL_COMMUNITY): Payer: BLUE CROSS/BLUE SHIELD | Admitting: Licensed Clinical Social Worker

## 2017-02-16 DIAGNOSIS — Z79899 Other long term (current) drug therapy: Secondary | ICD-10-CM | POA: Diagnosis not present

## 2017-02-16 DIAGNOSIS — F332 Major depressive disorder, recurrent severe without psychotic features: Secondary | ICD-10-CM | POA: Diagnosis not present

## 2017-02-16 DIAGNOSIS — F411 Generalized anxiety disorder: Secondary | ICD-10-CM | POA: Diagnosis not present

## 2017-02-16 MED ORDER — FLUOXETINE HCL 10 MG PO TABS
ORAL_TABLET | ORAL | 1 refills | Status: DC
Start: 2017-02-16 — End: 2017-03-25

## 2017-02-16 NOTE — Progress Notes (Signed)
Patient ID: Jamie AmenSamantha B Holtzer, female   DOB: 11/30/1995, 21 y.o.   MRN: [id number]  Ms Jolinda CroakSpaulding had stopped the fluoxetine after the overdose.  We will re start it at 5 mg for 4 days and then increase it to 10 mg.  She is sleeping better without medication

## 2017-02-16 NOTE — Progress Notes (Signed)
    Daily Group Progress Note  Program: IOP    Group Time: 9 AM - 12   Participation Level:  Active  Behavioral Response: Appropriate  Type of Therapy: Group Therapy  Summary of Progress: Pt presented with reserved demeanor and actively participated. Pt reports problems with anxiety and isolation and was able to identify tools to manage symptoms. Pt participated in chaplaincy group. Pt denies SI/HI.   Donia GuilesJenny Burton Gahan, LCSW

## 2017-02-17 ENCOUNTER — Other Ambulatory Visit (HOSPITAL_COMMUNITY): Payer: BLUE CROSS/BLUE SHIELD | Admitting: Psychiatry

## 2017-02-17 DIAGNOSIS — F332 Major depressive disorder, recurrent severe without psychotic features: Secondary | ICD-10-CM | POA: Diagnosis not present

## 2017-02-17 DIAGNOSIS — Z79899 Other long term (current) drug therapy: Secondary | ICD-10-CM | POA: Diagnosis not present

## 2017-02-17 DIAGNOSIS — F411 Generalized anxiety disorder: Secondary | ICD-10-CM | POA: Diagnosis not present

## 2017-02-17 NOTE — Progress Notes (Signed)
    Daily Group Progress Note  Program: IOP  Group Time: 9:00-12:00  Participation Level: Active  Behavioral Response: Appropriate  Type of Therapy:  Group Therapy  Summary of Progress: Pt. Presents as depressed with primarily flat affect. Pt. Stated that she is not sleeping well, but feels very drowsy all day. Pt. briefly discussed her desire to please her parents and fears of disappointing them because of her mental illness. Participated in discussion about Merchant navy officerhawn Achor and use of positive psychology for behavior change including 1-journaling, 2- 3 gratitudes, 3-physical exercise, 4- meditation, and 5- random acts of kindness.       Shaune PollackBrown, Delontae Lamm B, LPC

## 2017-02-17 NOTE — Progress Notes (Signed)
    Daily Group Progress Note  Program: IOP  Group Time: 9:00-12:00  Participation Level: Active  Behavioral Response: Appropriate  Type of Therapy:  Group Therapy  Summary of Progress: Pt.'s first day in group and met with the case manager and the psychiatrist. Pt. Participated in discussion facilitated by the mental health association regarding community resources and support groups for mental health.      Nancie Neas, LPC

## 2017-02-18 ENCOUNTER — Other Ambulatory Visit (HOSPITAL_COMMUNITY): Payer: BLUE CROSS/BLUE SHIELD | Admitting: Psychiatry

## 2017-02-18 DIAGNOSIS — F332 Major depressive disorder, recurrent severe without psychotic features: Secondary | ICD-10-CM | POA: Diagnosis not present

## 2017-02-18 DIAGNOSIS — F411 Generalized anxiety disorder: Secondary | ICD-10-CM | POA: Diagnosis not present

## 2017-02-18 DIAGNOSIS — Z79899 Other long term (current) drug therapy: Secondary | ICD-10-CM | POA: Diagnosis not present

## 2017-02-18 NOTE — Progress Notes (Signed)
    Daily Group Progress Note  Program: IOP  Group Time: 9:00-12:00  Participation Level: Active  Behavioral Response: Appropriate  Type of Therapy:  Group Therapy  Summary of Progress: Pt. Presents as depressed, primarily flat affect. Pt. Is participating more in group and appears to feel more comfortable with the group process. Pt. Discussed pressure that she feels to get better soon because she feels that she is a burden and inconvenience to her parents especially her father. Pt. Was encouraged to have a conversation with her father about what she needs during her recovery period especially because she is doing trauma work with her individual therapist. Pt. Participated in discussion about coping activities and skills sheet.     Shaune PollackBrown, Jennifer B, LPC

## 2017-02-19 ENCOUNTER — Other Ambulatory Visit (HOSPITAL_COMMUNITY): Payer: BLUE CROSS/BLUE SHIELD | Admitting: Psychiatry

## 2017-02-22 ENCOUNTER — Other Ambulatory Visit (HOSPITAL_COMMUNITY): Payer: BLUE CROSS/BLUE SHIELD | Attending: Psychiatry | Admitting: Psychiatry

## 2017-02-22 DIAGNOSIS — Z818 Family history of other mental and behavioral disorders: Secondary | ICD-10-CM | POA: Insufficient documentation

## 2017-02-22 DIAGNOSIS — F411 Generalized anxiety disorder: Secondary | ICD-10-CM | POA: Insufficient documentation

## 2017-02-22 DIAGNOSIS — F332 Major depressive disorder, recurrent severe without psychotic features: Secondary | ICD-10-CM

## 2017-02-22 NOTE — Progress Notes (Signed)
    Daily Group Progress Note  Program: IOP  Group Time: 9:00-12:00  Participation Level: Active  Behavioral Response: Appropriate  Type of Therapy:  Group Therapy  Summary of Progress: Pt. Presented as talkative, engaged in the group process, laughed appropriately. Pt. Discussed process of learning to accept herself in social culture where she has not always felt affirmed or validated. Pt. Participated in discussion about discussion about countering patterns of people pleasing and expectations that others will always like us in social situations.     Shaune PollackBrown, Kole Hilyard B, LPC

## 2017-02-23 ENCOUNTER — Other Ambulatory Visit (HOSPITAL_COMMUNITY): Payer: BLUE CROSS/BLUE SHIELD | Admitting: Psychiatry

## 2017-02-23 DIAGNOSIS — F332 Major depressive disorder, recurrent severe without psychotic features: Secondary | ICD-10-CM | POA: Diagnosis not present

## 2017-02-23 DIAGNOSIS — F411 Generalized anxiety disorder: Secondary | ICD-10-CM | POA: Diagnosis not present

## 2017-02-23 DIAGNOSIS — Z818 Family history of other mental and behavioral disorders: Secondary | ICD-10-CM | POA: Diagnosis not present

## 2017-02-23 NOTE — Progress Notes (Signed)
    Daily Group Progress Note  Program: IOP  Group Time: 9:00-12:00  Participation Level: Active  Behavioral Response: Appropriate  Type of Therapy:  Group Therapy  Summary of Progress: Pt. Presents with flat affect, depressed mood compared to last group on Thursday of last week. Pt. Attributes mood change to hormone changes and starting her cycle for first time in two years due to implant. Pt. Participated in medication management group with the pharmacist.     Shaune PollackBrown, Jennifer B, [facility]

## 2017-02-23 NOTE — Progress Notes (Signed)
Progress note  Ms Jamie Cross was wondering about the effect of the fluoxetine on her increasing depression over the weekend and till now.  I do not think the medication would be the culprit.  She says her period has returned after 2 years on the implant which she understands can happen after a couple of years on the implant.  When she had her periods before she had depression and she and I both suspect that is the reason for the current depression.  She will have to wait and see how it goes unfortunately. Plan:  Wait for the outcome of the current menstrual episode.  She is taking 10 mg fluoxetine and no side effects so far.

## 2017-02-24 ENCOUNTER — Other Ambulatory Visit (HOSPITAL_COMMUNITY): Payer: BLUE CROSS/BLUE SHIELD | Admitting: Psychiatry

## 2017-02-24 DIAGNOSIS — F332 Major depressive disorder, recurrent severe without psychotic features: Secondary | ICD-10-CM | POA: Diagnosis not present

## 2017-02-24 DIAGNOSIS — F411 Generalized anxiety disorder: Secondary | ICD-10-CM | POA: Diagnosis not present

## 2017-02-24 DIAGNOSIS — Z818 Family history of other mental and behavioral disorders: Secondary | ICD-10-CM | POA: Diagnosis not present

## 2017-02-25 ENCOUNTER — Other Ambulatory Visit (HOSPITAL_COMMUNITY): Payer: BLUE CROSS/BLUE SHIELD | Admitting: Psychiatry

## 2017-02-25 DIAGNOSIS — Z818 Family history of other mental and behavioral disorders: Secondary | ICD-10-CM | POA: Diagnosis not present

## 2017-02-25 DIAGNOSIS — F411 Generalized anxiety disorder: Secondary | ICD-10-CM | POA: Diagnosis not present

## 2017-02-25 DIAGNOSIS — F332 Major depressive disorder, recurrent severe without psychotic features: Secondary | ICD-10-CM | POA: Diagnosis not present

## 2017-02-26 ENCOUNTER — Other Ambulatory Visit (HOSPITAL_COMMUNITY): Payer: BLUE CROSS/BLUE SHIELD | Admitting: Psychiatry

## 2017-02-26 DIAGNOSIS — F332 Major depressive disorder, recurrent severe without psychotic features: Secondary | ICD-10-CM

## 2017-02-26 DIAGNOSIS — F411 Generalized anxiety disorder: Secondary | ICD-10-CM | POA: Diagnosis not present

## 2017-02-26 DIAGNOSIS — Z818 Family history of other mental and behavioral disorders: Secondary | ICD-10-CM | POA: Diagnosis not present

## 2017-03-01 ENCOUNTER — Other Ambulatory Visit (HOSPITAL_COMMUNITY): Payer: BLUE CROSS/BLUE SHIELD | Admitting: Psychiatry

## 2017-03-01 DIAGNOSIS — Z818 Family history of other mental and behavioral disorders: Secondary | ICD-10-CM | POA: Diagnosis not present

## 2017-03-01 DIAGNOSIS — F332 Major depressive disorder, recurrent severe without psychotic features: Secondary | ICD-10-CM

## 2017-03-01 DIAGNOSIS — F411 Generalized anxiety disorder: Secondary | ICD-10-CM | POA: Diagnosis not present

## 2017-03-01 NOTE — Progress Notes (Signed)
    Daily Group Progress Note  Program: IOP  Group Time: 9:00-12:00  Participation Level: Active  Behavioral Response: Appropriate  Type of Therapy:  Group Therapy  Summary of Progress: Pt. Presented as talkative, engaged in the group process. Pt. Reported that her depression seemed to be improving, although she was frustrated and did not sleep well last night. Pt. Discussed online interaction with a person online who questioned her gender identity. Pt. Was frustrated and disappointed with the person. Pt. Participated in discussion with the group about gender norms, roles, and identity.    Shaune PollackBrown, Markese Bloxham B, LPC

## 2017-03-01 NOTE — Progress Notes (Signed)
    Daily Group Progress Note  Program: IOP  Group Time: 9:00-12:00  Participation Level: Active  Behavioral Response: Appropriate  Type of Therapy:  Group Therapy  Summary of Progress:  Pt. Participated in medication management group with the pharmacist. Pt. Reported that she did not have concerns about medications and did not need to see the psychiatrist today. Pt. Presented as calm, engaged in the group process, discussed physical pain associated with her cycle and interventions I.e., heat and cold therapy, massage, tens unit that have helped her to manage her pain. Pt. Participated in conversation about emotional vs. Physical hunger, disordered eating, and use of mindfulness to make the best food choices for our bodies.       Shaune PollackBrown, Jennifer B, LPC

## 2017-03-01 NOTE — Progress Notes (Signed)
    Daily Group Progress Note  Program: IOP  Group Time: 9:00-12:00  Participation Level: Active  Behavioral Response: Appropriate  Type of Therapy:  Group Therapy  Summary of Progress: Pt. Reported that she was feeling better compared to earlier in the week. Pt. Participated in reflective reading, education about mindfulness based meditation, and breath focused meditation exercise.      Shaune PollackBrown, Jennifer B, LPC

## 2017-03-02 ENCOUNTER — Other Ambulatory Visit (HOSPITAL_COMMUNITY): Payer: BLUE CROSS/BLUE SHIELD | Admitting: Psychiatry

## 2017-03-02 DIAGNOSIS — Z818 Family history of other mental and behavioral disorders: Secondary | ICD-10-CM | POA: Diagnosis not present

## 2017-03-02 DIAGNOSIS — F411 Generalized anxiety disorder: Secondary | ICD-10-CM | POA: Diagnosis not present

## 2017-03-02 DIAGNOSIS — F332 Major depressive disorder, recurrent severe without psychotic features: Secondary | ICD-10-CM

## 2017-03-02 NOTE — Progress Notes (Signed)
    Daily Group Progress Note  Program: IOP  Group Time: 9:00-12:00  Participation Level: Active  Behavioral Response: Appropriate  Type of Therapy:  Group Therapy  Summary of Progress: Pt. Reports that she is feeling better, presents as engaged in the group process. Pt. Discussed process of accepting that her recovery is an ongoing process and will not happen overnight. Participated in discussion about mental health diagnoses I.e., depression, bipolar disorder, and BPD and understanding individual presentations and the importance of depersonalizing mental health disorders.      Shaune PollackBrown, Gennavieve Huq B, LPC

## 2017-03-02 NOTE — Progress Notes (Signed)
    Daily Group Progress Note  Program: IOP  Group Time: 9:00-12:00  Participation Level: Active  Behavioral Response: Appropriate  Type of Therapy:  Group Therapy  Summary of Progress: Pt. Presents with brightened mood, talkative, engaged in the group process. Pt. Reports that she was able to fall asleep, but was not able to stay asleep last night due to her dreams. Pt. Participated in grief and loss session facilitated by the Chaplain.      Shaune PollackBrown, Jennifer B, LPC

## 2017-03-02 NOTE — Progress Notes (Signed)
    Daily Group Progress Note  Program: IOP  Group Time: 9:00-12:00  Participation Level: Active  Behavioral Response: Appropriate  Type of Therapy:  Group Therapy  Summary of Progress: Pt. Presented with primarily flat affect, depressed. Pt. Shared with the group that she was having first period after two years and attributed her mood change to hormones. Pt. Met with the psychiatrist. Pt. Participated in grief and loss group with the Chaplain.      Nancie Neas, LPC

## 2017-03-03 ENCOUNTER — Other Ambulatory Visit (HOSPITAL_COMMUNITY): Payer: BLUE CROSS/BLUE SHIELD | Admitting: Psychiatry

## 2017-03-03 DIAGNOSIS — F332 Major depressive disorder, recurrent severe without psychotic features: Secondary | ICD-10-CM

## 2017-03-03 DIAGNOSIS — F411 Generalized anxiety disorder: Secondary | ICD-10-CM | POA: Diagnosis not present

## 2017-03-03 DIAGNOSIS — Z818 Family history of other mental and behavioral disorders: Secondary | ICD-10-CM | POA: Diagnosis not present

## 2017-03-03 NOTE — Progress Notes (Signed)
    Daily Group Progress Note  Program: IOP  Group Time: 9:00-12:00  Participation Level: Active  Behavioral Response: Appropriate  Type of Therapy:  Group Therapy  Summary of Progress: Pt. Presented with brightened affect compared to earlier in the week. Pt. Presented as talkative and engaged in the therapeutic process. Pt. Reported that yesterday was a good day because she was able to spend time with friends from high school and with a female friend who is in town for the summer. Participated in discussion about social media and how to identify how facebook etc. Are contributing to positive mental health and wellness and to set parameters and boundaries on its use such as removing self from FB or blocking or unfriending those who are not immediate family or friends.      Shaune PollackBrown, Jennifer B, LPC

## 2017-03-04 ENCOUNTER — Other Ambulatory Visit (HOSPITAL_COMMUNITY): Payer: BLUE CROSS/BLUE SHIELD | Admitting: Psychiatry

## 2017-03-04 DIAGNOSIS — F332 Major depressive disorder, recurrent severe without psychotic features: Secondary | ICD-10-CM | POA: Diagnosis not present

## 2017-03-04 DIAGNOSIS — F411 Generalized anxiety disorder: Secondary | ICD-10-CM | POA: Diagnosis not present

## 2017-03-04 DIAGNOSIS — Z818 Family history of other mental and behavioral disorders: Secondary | ICD-10-CM | POA: Diagnosis not present

## 2017-03-05 ENCOUNTER — Other Ambulatory Visit (HOSPITAL_COMMUNITY): Payer: BLUE CROSS/BLUE SHIELD | Admitting: Psychiatry

## 2017-03-05 DIAGNOSIS — F411 Generalized anxiety disorder: Secondary | ICD-10-CM

## 2017-03-05 DIAGNOSIS — F332 Major depressive disorder, recurrent severe without psychotic features: Secondary | ICD-10-CM

## 2017-03-05 DIAGNOSIS — Z818 Family history of other mental and behavioral disorders: Secondary | ICD-10-CM | POA: Diagnosis not present

## 2017-03-05 NOTE — Progress Notes (Signed)
Jamie Cross is a 21 y.o. single, unemployed, Caucasian female; who was referred per TTS; treatment for worsening depressive and anxiety symptoms.  Pt had an OD.  "I was just tired of going to bed every night and not being able to sleep.  I didn't want to die, I just wanted some sleep."  According to pt she has struggled with depression since middle school, but the episodes have been getting longer since her junior yr in high school.  Triggers/Stressors:  1)  School ([employer]).  Pt transferred there due to depression.  Reported she had a lot on her plate (ie. full time student, clubs, was working).  Mentioned there was a bad roommate situation which occurred.  Pt ended up quitting the job.  States the customers were verbally abusive.  "My parents (who are divorced) are supportive, but without a job I don't have any money on my own."  2)  Body Self Image Issues.  Stateed this stemmed from being bullied in school when she was younger.  Reports one prior psych hospitalization in [facility] due to depression.  No prior suicide attempts except for the one recently.  Has been seeing [name], LCAS on and off since middle school.  Hx of seeing Dr. [name], requesting to start back with her.  Family Hx:  Maternal GM (Depression & Anxiety), Mother (Depression and Anxiety), Sister (Anxiety and Hx of drugs), Father (Recovering alcoholic)                                                       Pt completed MH-IOP today.  Reports overall mood improving.  States her sleep and appetite are better.  "I am taking one day at a time."  Pt is working on structuring her days.  Will be going to the beach with her family next week and once she returns plans to apply to volunteer somewhere.  Denies SI/HI or A/V hallucinations.  A:  D/C today.  F/U with [name], LCAS on a weekly basis and Dr. [name] on 03-25-17 @ 4:30 pm.  Encouraged support groups.  Highly recommended volunteering.  R:  Pt receptive.     Chestine SporeLARK, RITA, M.Ed, CNA

## 2017-03-05 NOTE — Patient Instructions (Signed)
D:  Patient successfully completed MH-IOP today.  A:  Discharge today.  Follow up with [name], LCAS weekly and Dr. [name] on 03-25-17 @ 4:30 pm.  Encouraged support groups.  R:  Pt receptive.

## 2017-03-05 NOTE — Progress Notes (Signed)
BH MD/PA/NP IOP Discharge Note  03/05/2017 10:52 AM [name]  MRN:  [id number]  Chief Complaint: IOP discharge note  Subjective:  Jamie Cross reports that she is doing well on Prozac 10 mg. She has learned and ample number of coping skills and cognitive techniques from the IOP. She reports that she feels like she is an emotionally positive place. She denies any suicidality or self harm behaviors. She reports adequate by mouth intake. Denies any significant side effects from Prozac. Denies any headaches, dizziness, chest pain, cough, difficulty breathing, nausea, vomiting, skin rash. She has scheduled follow-up in this clinic with psychiatry in about 1 month. Reviewed that she has adequate medications to make it to her next appointment. She has no other questions or concerns at this time.  Visit Diagnosis:    ICD-10-CM   1. Generalized anxiety disorder F41.1   2. Severe recurrent major depression without psychotic features (HCC) F33.2     Past Psychiatric History: See intake H&P for full details. Reviewed, with no updates at this time.   Past Medical History:  Past Medical History:  Diagnosis Date  . Anxiety   . Depression     Past Surgical History:  Procedure Laterality Date  . TONSILLECTOMY      Family Psychiatric History: See intake H&P for full details. Reviewed, with no updates at this time.   Family History:  Family History  Problem Relation Age of Onset  . Alcohol abuse Paternal Aunt   . Depression Maternal Uncle   . Depression Maternal Grandmother   . Anxiety disorder Maternal Grandmother   . Depression Paternal Grandmother   . Bipolar disorder Other   . Depression Mother   . Anxiety disorder Sister   . Suicidality Neg Hx     Social History:  Social History   Social History  . Marital status: Single    Spouse name: N/A  . Number of children: N/A  . Years of education: N/A   Social History Main Topics  . Smoking status: Never Smoker   . Smokeless tobacco: Never Used  . Alcohol use No  . Drug use: No  . Sexual activity: Not on file   Other Topics Concern  . Not on file   Social History Narrative  . No narrative on file    Allergies: No Known Allergies  Metabolic Disorder Labs: No results found for: HGBA1C, MPG No results found for: PROLACTIN Lab Results  Component Value Date   CHOL 118 04/08/2014   TRIG 91 04/08/2014   HDL 26 (L) 04/08/2014   CHOLHDL 4.5 04/08/2014   VLDL 18 04/08/2014   LDLCALC 74 04/08/2014     Current Medications: Current Outpatient Prescriptions  Medication Sig Dispense Refill  . FLUoxetine (PROZAC) 10 MG tablet Take 1/2 tablet daily for 4 days and then one tablet daily 30 tablet 1   No current facility-administered medications for this visit.     Neurologic: Headache: Negative Seizure: Negative Paresthesias: Negative  Musculoskeletal: Strength & Muscle Tone: within normal limits Gait & Station: normal Patient leans: N/A  Psychiatric Specialty Exam: ROS  There were no vitals taken for this visit.There is no height or weight on file to calculate BMI.  General Appearance: Casual and Well Groomed  Eye Contact:  Good  Speech:  Clear and Coherent  Volume:  Normal  Mood:  Euthymic  Affect:  Appropriate  Thought Process:  Goal Directed  Orientation:  Full (Time, Place, and Person)  Thought Content: Logical  Suicidal Thoughts:  No  Homicidal Thoughts:  No  Memory:  Immediate;   Good  Judgement:  Good  Insight:  Good  Psychomotor Activity:  Normal  Concentration:  Concentration: Good  Recall:  Negative  Fund of Knowledge: Good  Language: Good  Akathisia:  Negative  Handed:  Right  AIMS (if indicated):  0  Assets:  Communication Skills Desire for Improvement Financial Resources/Insurance Housing Intimacy Leisure Time Physical Health Resilience Social Support Talents/Skills Transportation Vocational/Educational  ADL's:  Intact  Cognition: WNL  Sleep:   7-9 hours    Treatment Plan Summary: Jamie Cross is a 21 year old female, Consulting civil engineerstudent at [employer], who has completed participation in the IOP after struggling with depression and passive suicidality. She has had reconstitution of her mood symptoms with Prozac 10 mg and the therapies as provided. She is appropriate for discharge from the IOP and has scheduled follow-up with Dr. [name] next month.  No acute safety issues at this time.  1. Generalized anxiety disorder   2. Severe recurrent major depression without psychotic features [facility](HCC)     Follow-up with Dr. [name] as scheduled Continue Prozac 10 mg daily  Burnard LeighAlexander Arya Shunda Rabadi, MD 03/05/2017, 10:52 AM

## 2017-03-08 ENCOUNTER — Other Ambulatory Visit (HOSPITAL_COMMUNITY): Payer: BLUE CROSS/BLUE SHIELD

## 2017-03-09 ENCOUNTER — Other Ambulatory Visit (HOSPITAL_COMMUNITY): Payer: BLUE CROSS/BLUE SHIELD

## 2017-03-10 ENCOUNTER — Other Ambulatory Visit (HOSPITAL_COMMUNITY): Payer: BLUE CROSS/BLUE SHIELD

## 2017-03-11 ENCOUNTER — Other Ambulatory Visit (HOSPITAL_COMMUNITY): Payer: BLUE CROSS/BLUE SHIELD

## 2017-03-12 ENCOUNTER — Other Ambulatory Visit (HOSPITAL_COMMUNITY): Payer: BLUE CROSS/BLUE SHIELD

## 2017-03-12 NOTE — Progress Notes (Signed)
    Daily Group Progress Note  Program: IOP  Group Time: 9:00-12:00  Participation Level: Active  Behavioral Response: Appropriate  Type of Therapy:  Group Therapy  Summary of Progress: Pt. Presented with significantly brightened affect. Pt. Presented as talkative, hopeful about her future. Pt. Prepared for discharge and met with the case manager and psychiatrist. Pt. Received positive feedback regarding her contribution to the group.    Nancie Neas, LPC

## 2017-03-12 NOTE — Progress Notes (Signed)
    Daily Group Progress Note  Program: IOP  Group Time: 9:00-12:00  Participation Level: Active  Behavioral Response: Appropriate  Type of Therapy:  Group Therapy  Summary of Progress: Pt. Reported that she was feeling good and ready for discharge on tomorrow. Pt. Discussed her plans to go the beach with her family and that she was going to [employer] to shop for a bathing suit. Pt. Provided helpful feedback to other group members.      Shaune PollackBrown, Lynzi Meulemans B, LPC

## 2017-03-15 ENCOUNTER — Other Ambulatory Visit (HOSPITAL_COMMUNITY): Payer: BLUE CROSS/BLUE SHIELD

## 2017-03-16 ENCOUNTER — Other Ambulatory Visit (HOSPITAL_COMMUNITY): Payer: BLUE CROSS/BLUE SHIELD

## 2017-03-17 ENCOUNTER — Other Ambulatory Visit (HOSPITAL_COMMUNITY): Payer: BLUE CROSS/BLUE SHIELD

## 2017-03-18 ENCOUNTER — Other Ambulatory Visit (HOSPITAL_COMMUNITY): Payer: BLUE CROSS/BLUE SHIELD

## 2017-03-19 ENCOUNTER — Other Ambulatory Visit (HOSPITAL_COMMUNITY): Payer: BLUE CROSS/BLUE SHIELD

## 2017-03-22 ENCOUNTER — Other Ambulatory Visit (HOSPITAL_COMMUNITY): Payer: BLUE CROSS/BLUE SHIELD

## 2017-03-23 ENCOUNTER — Other Ambulatory Visit (HOSPITAL_COMMUNITY): Payer: BLUE CROSS/BLUE SHIELD

## 2017-03-25 ENCOUNTER — Ambulatory Visit (INDEPENDENT_AMBULATORY_CARE_PROVIDER_SITE_OTHER): Payer: BLUE CROSS/BLUE SHIELD | Admitting: Psychiatry

## 2017-03-25 ENCOUNTER — Encounter (HOSPITAL_COMMUNITY): Payer: Self-pay | Admitting: Psychiatry

## 2017-03-25 ENCOUNTER — Other Ambulatory Visit (HOSPITAL_COMMUNITY): Payer: BLUE CROSS/BLUE SHIELD

## 2017-03-25 VITALS — BP 108/78 | HR 100 | Ht 66.0 in | Wt 122.0 lb

## 2017-03-25 DIAGNOSIS — F411 Generalized anxiety disorder: Secondary | ICD-10-CM

## 2017-03-25 DIAGNOSIS — F331 Major depressive disorder, recurrent, moderate: Secondary | ICD-10-CM | POA: Diagnosis not present

## 2017-03-25 DIAGNOSIS — Z818 Family history of other mental and behavioral disorders: Secondary | ICD-10-CM | POA: Diagnosis not present

## 2017-03-25 DIAGNOSIS — Z811 Family history of alcohol abuse and dependence: Secondary | ICD-10-CM | POA: Diagnosis not present

## 2017-03-25 DIAGNOSIS — Z79899 Other long term (current) drug therapy: Secondary | ICD-10-CM

## 2017-03-25 MED ORDER — FLUOXETINE HCL 20 MG PO TABS: 20.0000 mg | ORAL_TABLET | Freq: Every day | ORAL | 2 refills | Status: DC

## 2017-03-25 NOTE — Progress Notes (Signed)
BH MD/PA/NP OP Progress Note  03/25/2017 4:23 PM Jamie Cross  MRN:  [id number]  Chief Complaint:  Chief Complaint    Follow-up      HPI: Pt states she is doing well. She completed  2 weeks of IOP in mid June. It was helpful and she is utilizing some grounding techniques to help with anxiety. She is journal-ing.  She is still struggling with anxiety. Some days she has no anxiety. She has at least 2 days a week she is very anxious with racing thoughts.   Pt has 2 bad days of depression of week. She is tired, low motivation, isolating and sad. It lasts from hours to all day. Pt will try to do something like see friends and go out to feel better. Pt denies SI/HI/AVH.  Sleep is good. She is having vivid dreams since starting Prozac.   Taking meds as prescribed and denies SE.   Visit Diagnosis:    ICD-10-CM   1. Moderate episode of recurrent major depressive disorder (HCC) F33.1 FLUoxetine (PROZAC) 20 MG tablet  2. GAD (generalized anxiety disorder) F41.1 FLUoxetine (PROZAC) 20 MG tablet     Past Psychiatric History:  Anxiety: Yes Bipolar Disorder: No Depression: Yes Mania: No Psychosis: No Schizophrenia: No Personality Disorder: No Hospitalization for psychiatric illness: Yes History of Electroconvulsive Shock Therapy: No Prior Suicide Attempts: No  Past Medical History:  Past Medical History:  Diagnosis Date  . Anxiety   . Depression     Past Surgical History:  Procedure Laterality Date  . TONSILLECTOMY      Family Psychiatric History:   Family History  Problem Relation Age of Onset  . Alcohol abuse Paternal Aunt   . Depression Maternal Uncle   . Depression Maternal Grandmother   . Anxiety disorder Maternal Grandmother   . Depression Paternal Grandmother   . Bipolar disorder Other   . Depression Mother   . Anxiety disorder Sister   . Suicidality Neg Hx     Social History:  Social History   Social History  . Marital status: Single    Spouse  name: N/A  . Number of children: N/A  . Years of education: N/A   Social History Main Topics  . Smoking status: Never Smoker  . Smokeless tobacco: Never Used  . Alcohol use No  . Drug use: No  . Sexual activity: Not on file   Other Topics Concern  . Not on file   Social History Narrative  . No narrative on file    Allergies: No Known Allergies  Metabolic Disorder Labs: No results found for: HGBA1C, MPG No results found for: PROLACTIN Lab Results  Component Value Date   CHOL 118 04/08/2014   TRIG 91 04/08/2014   HDL 26 (L) 04/08/2014   CHOLHDL 4.5 04/08/2014   VLDL 18 04/08/2014   LDLCALC 74 04/08/2014     Current Medications: Current Outpatient Prescriptions  Medication Sig Dispense Refill  . FLUoxetine (PROZAC) 10 MG tablet Take 1/2 tablet daily for 4 days and then one tablet daily 30 tablet 1   No current facility-administered medications for this visit.      Musculoskeletal: Strength & Muscle Tone: within normal limits Gait & Station: normal Patient leans: N/A  Psychiatric Specialty Exam: Review of Systems  Gastrointestinal: Negative for abdominal pain, constipation, diarrhea, heartburn, nausea and vomiting.  Neurological: Negative for dizziness, tremors, sensory change and headaches.  Psychiatric/Behavioral: Positive for depression. Negative for hallucinations, substance abuse and suicidal ideas. The patient is  nervous/anxious. The patient does not have insomnia.     Blood pressure 108/78, pulse 100, height 5\' 6"  (1.676 m), weight 122 lb (55.3 kg), SpO2 98 %.Body mass index is 19.69 kg/m.  General Appearance: Fairly Groomed  Eye Contact:  Good  Speech:  Clear and Coherent and Normal Rate  Volume:  Normal  Mood:  Anxious and Depressed  Affect:  Full Range  Thought Process:  Goal Directed and Descriptions of Associations: Intact  Orientation:  Full (Time, Place, and Person)  Thought Content: Logical   Suicidal Thoughts:  No  Homicidal Thoughts:  No   Memory:  Immediate;   Good Recent;   Good Remote;   Good  Judgement:  Good  Insight:  Good  Psychomotor Activity:  Normal  Concentration:  Concentration: Good and Attention Span: Good  Recall:  Good  Fund of Knowledge: Good  Language: Good  Akathisia:  No  Handed:  Right  AIMS (if indicated):  n/a  Assets:  Communication Skills Desire for Improvement Housing  ADL's:  Intact  Cognition: WNL  Sleep:  good     Treatment Plan Summary:Medication management  Assessment: MDD-recurrent, moderate; GAD   Medication management with supportive therapy. Risks/benefits and SE of the medication discussed. Pt verbalized understanding and verbal consent obtained for treatment.  Affirm with the patient that the medications are taken as ordered. Patient expressed understanding of how their medications were to be used.   Meds: increase Prozac 20mg  po qD for depression and anxiety  Labs: none  Therapy: brief supportive therapy provided. Discussed psychosocial stressors in detail.    Consultations: pt just completed IOP  Pt denies SI and is at an acute low risk for suicide. Patient told to call clinic if any problems occur. Patient advised to go to ER if they should develop SI/HI, side effects, or if symptoms worsen. Has crisis numbers to call if needed. Pt verbalized understanding.  F/up in 2 months or sooner if needed  Oletta Darter, MD 03/25/2017, 4:23 PM

## 2017-03-26 ENCOUNTER — Other Ambulatory Visit (HOSPITAL_COMMUNITY): Payer: BLUE CROSS/BLUE SHIELD

## 2017-03-29 ENCOUNTER — Other Ambulatory Visit (HOSPITAL_COMMUNITY): Payer: BLUE CROSS/BLUE SHIELD

## 2017-03-30 ENCOUNTER — Other Ambulatory Visit (HOSPITAL_COMMUNITY): Payer: BLUE CROSS/BLUE SHIELD

## 2017-03-31 ENCOUNTER — Other Ambulatory Visit (HOSPITAL_COMMUNITY): Payer: BLUE CROSS/BLUE SHIELD

## 2017-04-01 ENCOUNTER — Other Ambulatory Visit (HOSPITAL_COMMUNITY): Payer: BLUE CROSS/BLUE SHIELD

## 2017-04-02 ENCOUNTER — Other Ambulatory Visit (HOSPITAL_COMMUNITY): Payer: BLUE CROSS/BLUE SHIELD

## 2017-04-05 ENCOUNTER — Other Ambulatory Visit (HOSPITAL_COMMUNITY): Payer: BLUE CROSS/BLUE SHIELD

## 2017-04-06 ENCOUNTER — Other Ambulatory Visit (HOSPITAL_COMMUNITY): Payer: BLUE CROSS/BLUE SHIELD

## 2017-04-07 ENCOUNTER — Other Ambulatory Visit (HOSPITAL_COMMUNITY): Payer: BLUE CROSS/BLUE SHIELD

## 2017-05-17 ENCOUNTER — Telehealth (HOSPITAL_COMMUNITY): Payer: Self-pay

## 2017-05-17 NOTE — Telephone Encounter (Signed)
Patient called and said that the Prozac 10 mg is only $4 a month and the 20 mg was $50. She would like to know if you can send in a new prescription for 2 10 mg tabs a day. Please review and advise, thank you

## 2017-05-20 NOTE — Telephone Encounter (Signed)
Yes that's fine 

## 2017-05-21 MED ORDER — FLUOXETINE HCL 10 MG PO CAPS: 20.0000 mg | ORAL_CAPSULE | Freq: Every day | ORAL | 2 refills | Status: DC

## 2017-05-21 NOTE — Telephone Encounter (Signed)
New order sent to the pharmacy and I called the patient to let her know

## 2017-06-03 ENCOUNTER — Ambulatory Visit (INDEPENDENT_AMBULATORY_CARE_PROVIDER_SITE_OTHER): Payer: BLUE CROSS/BLUE SHIELD | Admitting: Psychiatry

## 2017-06-03 ENCOUNTER — Encounter (HOSPITAL_COMMUNITY): Payer: Self-pay | Admitting: Psychiatry

## 2017-06-03 VITALS — BP 120/70 | HR 67 | Ht 66.0 in | Wt 125.2 lb

## 2017-06-03 DIAGNOSIS — F330 Major depressive disorder, recurrent, mild: Secondary | ICD-10-CM

## 2017-06-03 DIAGNOSIS — Z818 Family history of other mental and behavioral disorders: Secondary | ICD-10-CM

## 2017-06-03 DIAGNOSIS — F411 Generalized anxiety disorder: Secondary | ICD-10-CM | POA: Diagnosis not present

## 2017-06-03 DIAGNOSIS — Z811 Family history of alcohol abuse and dependence: Secondary | ICD-10-CM

## 2017-06-03 DIAGNOSIS — R450 Nervousness: Secondary | ICD-10-CM | POA: Diagnosis not present

## 2017-06-03 MED ORDER — FLUOXETINE HCL 10 MG PO CAPS: 20.0000 mg | ORAL_CAPSULE | Freq: Every day | ORAL | 2 refills | Status: DC

## 2017-06-03 NOTE — Progress Notes (Signed)
BH MD/PA/NP OP Progress Note  06/03/2017 9:05 AM Jamie Cross  MRN:  [id number]  Chief Complaint:  Chief Complaint    Follow-up; Medication Refill     HPI: "Evertyhing has been going pretty well".  She has started her online classes for the semester.  Her anxiety is a lot more manageable. Pt notes she tries to cram her weeks worth of work into 2 days but states she has always been that way.   Depression is present but is more manageable. Pt states having a schedule is helping a lot. Pt has 1-2 days a week where she feels down and foggy. It is not as severe as it used to be. She is no longer having low motivation or spending all day in bed. Sleep is ok but she is still having vivid dreams that leave her feeling a little tired in the morning. Pt energy and appetite are good. Pt is being social and spending times with friends. Pt denies SI/HI.  Taking meds as prescribed and reports SE of vivid dreams.   Visit Diagnosis:    ICD-10-CM   1. Mild episode of recurrent major depressive disorder (HCC) F33.0 FLUoxetine (PROZAC) 10 MG capsule  2. GAD (generalized anxiety disorder) F41.1 FLUoxetine (PROZAC) 10 MG capsule       Past Psychiatric History:  Anxiety:Yes Bipolar Disorder:No Depression:Yes Mania:No Psychosis:No Schizophrenia:No Personality Disorder:No Hospitalization for psychiatric illness:Yes History of Electroconvulsive Shock Therapy:No Prior Suicide Attempts:No  Past Medical History:  Past Medical History:  Diagnosis Date  . Anxiety   . Depression     Past Surgical History:  Procedure Laterality Date  . TONSILLECTOMY      Family Psychiatric History: Family History  Problem Relation Age of Onset  . Alcohol abuse Paternal Aunt   . Depression Maternal Uncle   . Depression Maternal Grandmother   . Anxiety disorder Maternal Grandmother   . Depression Paternal Grandmother   . Bipolar disorder Other   . Depression Mother   . Anxiety disorder Sister    . Suicidality Neg Hx     Social History:  Social History   Social History  . Marital status: Single    Spouse name: N/A  . Number of children: N/A  . Years of education: N/A   Social History Main Topics  . Smoking status: Never Smoker  . Smokeless tobacco: Never Used  . Alcohol use No  . Drug use: No  . Sexual activity: Yes    Partners: Male    Birth control/ protection: Implant, Condom   Other Topics Concern  . None   Social History Narrative  . None    Allergies: No Known Allergies  Metabolic Disorder Labs: No results found for: HGBA1C, MPG No results found for: PROLACTIN Lab Results  Component Value Date   CHOL 118 04/08/2014   TRIG 91 04/08/2014   HDL 26 (L) 04/08/2014   CHOLHDL 4.5 04/08/2014   VLDL 18 04/08/2014   LDLCALC 74 04/08/2014   Lab Results  Component Value Date   TSH 2.760 04/08/2014    Therapeutic Level Labs: No results found for: LITHIUM No results found for: VALPROATE No components found for:  CBMZ  Current Medications: Current Outpatient Prescriptions  Medication Sig Dispense Refill  . FLUoxetine (PROZAC) 10 MG capsule Take 2 capsules (20 mg total) by mouth daily. 60 capsule 2   No current facility-administered medications for this visit.      Musculoskeletal: Strength & Muscle Tone: within normal limits Gait & Station: normal  Patient leans: N/A  Psychiatric Specialty Exam: Review of Systems  Neurological: Negative for dizziness, tingling, sensory change and headaches.  Psychiatric/Behavioral: Positive for depression. Negative for hallucinations, substance abuse and suicidal ideas. The patient is nervous/anxious. The patient does not have insomnia.     Blood pressure 120/70, pulse 67, height  (1.676 m), weight 125 lb 3.2 oz (56.8 kg).Body mass index is 20.21 kg/m.  General Appearance: Fairly Groomed  Eye Contact:  Good  Speech:  Clear and Coherent and Normal Rate  Volume:  Normal  Mood:  Anxious  Affect:  Full  Range  Thought Process:  Goal Directed and Descriptions of Associations: Intact  Orientation:  Full (Time, Place, and Person)  Thought Content: Logical   Suicidal Thoughts:  No  Homicidal Thoughts:  No  Memory:  Immediate;   Good Recent;   Good Remote;   Good  Judgement:  Good  Insight:  Good  Psychomotor Activity:  Normal  Concentration:  Concentration: Good and Attention Span: Good  Recall:  Good  Fund of Knowledge: Good  Language: Good  Akathisia:  No  Handed:  Right  AIMS (if indicated): not done  Assets:  Communication Skills Desire for Improvement Financial Resources/Insurance Housing Leisure Time Physical Health Resilience Social Support Talents/Skills Transportation Vocational/Educational  ADL's:  Intact  Cognition: WNL  Sleep:  Good   Screenings:   Assessment and Plan: MDD-recurrent,moderate; GAD   Medication management with supportive therapy. Risks/benefits and SE of the medication discussed. Pt verbalized understanding and verbal consent obtained for treatment.  Affirm with the patient that the medications are taken as ordered. Patient expressed understanding of how their medications were to be used.   The risk of un-intended pregnancy is low based on the fact that pt reports she is has nexaplon implant. Pt is aware that these meds carry a teratogenic risk. Pt will discuss plan of action if she does or plans to become pregnant in the future.   Meds: Prozac  po qD for depression and anxiety   Labs: none  Therapy: brief supportive therapy provided. Discussed psychosocial stressors in detail.     Consultations: none  Pt denies SI and is at an acute low risk for suicide. Patient told to call clinic if any problems occur. Patient advised to go to ER if they should develop SI/HI, side effects, or if symptoms worsen. Has crisis numbers to call if needed. Pt verbalized understanding.  F/up in 3 months or sooner if needed    Oletta Darter,  MD 06/03/2017, 9:05 AM

## 2017-08-23 DIAGNOSIS — M9907 Segmental and somatic dysfunction of upper extremity: Secondary | ICD-10-CM | POA: Diagnosis not present

## 2017-08-23 DIAGNOSIS — M9902 Segmental and somatic dysfunction of thoracic region: Secondary | ICD-10-CM | POA: Diagnosis not present

## 2017-08-26 DIAGNOSIS — M9902 Segmental and somatic dysfunction of thoracic region: Secondary | ICD-10-CM | POA: Diagnosis not present

## 2017-08-26 DIAGNOSIS — M9907 Segmental and somatic dysfunction of upper extremity: Secondary | ICD-10-CM | POA: Diagnosis not present

## 2017-09-02 ENCOUNTER — Encounter (HOSPITAL_COMMUNITY): Payer: Self-pay | Admitting: Psychiatry

## 2017-09-02 ENCOUNTER — Ambulatory Visit (INDEPENDENT_AMBULATORY_CARE_PROVIDER_SITE_OTHER): Payer: BLUE CROSS/BLUE SHIELD | Admitting: Psychiatry

## 2017-09-02 DIAGNOSIS — F330 Major depressive disorder, recurrent, mild: Secondary | ICD-10-CM

## 2017-09-02 DIAGNOSIS — F411 Generalized anxiety disorder: Secondary | ICD-10-CM | POA: Diagnosis not present

## 2017-09-02 DIAGNOSIS — Z811 Family history of alcohol abuse and dependence: Secondary | ICD-10-CM

## 2017-09-02 DIAGNOSIS — Z818 Family history of other mental and behavioral disorders: Secondary | ICD-10-CM | POA: Diagnosis not present

## 2017-09-02 MED ORDER — FLUOXETINE HCL 10 MG PO CAPS: 20.0000 mg | ORAL_CAPSULE | Freq: Every day | ORAL | 3 refills | Status: DC

## 2017-09-02 NOTE — Progress Notes (Signed)
BH MD/PA/NP OP Progress Note  09/02/2017 9:11 AM Jamie Cross  MRN:  [id number]  Chief Complaint:  Chief Complaint    Follow-up; Depression     HPI: "Things are going really well". She feels 20mg  of Prozac was good for her. She finished her semester strong and was able to deal wit her stress much better. Pt is enjoying school in a way that she has never done before. Pt is moving from online classes to in person but she is excited.    Pt depression is mild. Pt has a few days where she feel down when stressed.  She denies anhedonia and worthlessness. Pt denies hopelessness. Sleep, appetite and energy are good. Pt denies SI/HI.   Anxiety is present but managable. She has it related to school stress.   Pt states-taking meds as prescribed and reports SE of decreased appetite. Her weight is staying steady.   Visit Diagnosis:    ICD-10-CM   1. Mild episode of recurrent major depressive disorder (HCC) F33.0 FLUoxetine (PROZAC) 10 MG capsule  2. GAD (generalized anxiety disorder) F41.1 FLUoxetine (PROZAC) 10 MG capsule     Past Psychiatric History:  Anxiety: Yes Bipolar Disorder: No Depression: Yes Mania: No Psychosis: No Schizophrenia: No Personality Disorder: No Hospitalization for psychiatric illness: Yes History of Electroconvulsive Shock Therapy: No Prior Suicide Attempts: No   Past Medical History:  Past Medical History:  Diagnosis Date  . Anxiety   . Depression     Past Surgical History:  Procedure Laterality Date  . TONSILLECTOMY      Family Psychiatric History:  Family History  Problem Relation Age of Onset  . Alcohol abuse Paternal Aunt   . Depression Maternal Uncle   . Depression Maternal Grandmother   . Anxiety disorder Maternal Grandmother   . Depression Paternal Grandmother   . Bipolar disorder Other   . Depression Mother   . Anxiety disorder Sister   . Suicidality Neg Hx     Social History:  Social History   Socioeconomic History  .  Marital status: Single    Spouse name: None  . Number of children: None  . Years of education: None  . Highest education level: None  Social Needs  . Financial resource strain: None  . Food insecurity - worry: None  . Food insecurity - inability: None  . Transportation needs - medical: None  . Transportation needs - non-medical: None  Occupational History  . None  Tobacco Use  . Smoking status: Never Smoker  . Smokeless tobacco: Never Used  Substance and Sexual Activity  . Alcohol use: No  . Drug use: No  . Sexual activity: Yes    Partners: Male    Birth control/protection: Implant, Condom  Other Topics Concern  . None  Social History Narrative  . None    Allergies: No Known Allergies  Metabolic Disorder Labs: No results found for: HGBA1C, MPG No results found for: PROLACTIN Lab Results  Component Value Date   CHOL 118 04/08/2014   TRIG 91 04/08/2014   HDL 26 (L) 04/08/2014   CHOLHDL 4.5 04/08/2014   VLDL 18 04/08/2014   LDLCALC 74 04/08/2014   Lab Results  Component Value Date   TSH 2.760 04/08/2014    Therapeutic Level Labs: No results found for: LITHIUM No results found for: VALPROATE No components found for:  CBMZ  Current Medications: Current Outpatient Medications  Medication Sig Dispense Refill  . FLUoxetine (PROZAC) 10 MG capsule Take 2 capsules (20 mg total)  by mouth daily. 60 capsule 2   No current facility-administered medications for this visit.      Musculoskeletal: Strength & Muscle Tone: within normal limits Gait & Station: normal Patient leans: N/A  Psychiatric Specialty Exam: Review of Systems  Constitutional: Negative for chills, fever and weight loss.  HENT: Negative for congestion, ear pain, sinus pain and sore throat.   Neurological: Negative for weakness.    Blood pressure 129/89, pulse 79, temperature 98.5 F (36.9 C), temperature source Oral, resp. rate 17, height 5\' 5"  (1.651 m), weight 125 lb 12.8 oz (57.1 kg), SpO2  100 %.Body mass index is 20.93 kg/m.  General Appearance: Fairly Groomed  Eye Contact:  Good  Speech:  Clear and Coherent and Normal Rate  Volume:  Normal  Mood:  Euthymic  Affect:  Full Range  Thought Process:  Goal Directed and Descriptions of Associations: Intact  Orientation:  Full (Time, Place, and Person)  Thought Content: Logical   Suicidal Thoughts:  No  Homicidal Thoughts:  No  Memory:  Immediate;   Good Recent;   Good Remote;   Good  Judgement:  Good  Insight:  Good  Psychomotor Activity:  Normal  Concentration:  Concentration: Good and Attention Span: Good  Recall:  Good  Fund of Knowledge: Good  Language: Good  Akathisia:  No  Handed:  Right  AIMS (if indicated): not done  Assets:  Communication Skills Desire for Improvement Financial Resources/Insurance Housing Leisure Time Physical Health Resilience Social Support Transportation Vocational/Educational  ADL's:  Intact  Cognition: WNL  Sleep:  Good   Screenings:   Assessment and Plan: MDD- recurrent, mild; GAD    Medication management with supportive therapy. Risks/benefits and SE of the medication discussed. Pt verbalized understanding and verbal consent obtained for treatment.  Affirm with the patient that the medications are taken as ordered. Patient expressed understanding of how their medications were to be used.   Meds: Prozac 20mg  po qD for depression and anxiety   Labs: none  Therapy: brief supportive therapy provided. Discussed psychosocial stressors in detail.     Consultations: Encouraged to follow up with therapist but is on a prn basis Encouraged to follow up with PCP as needed  Pt denies SI and is at an acute low risk for suicide. Patient told to call clinic if any problems occur. Patient advised to go to ER if they should develop SI/HI, side effects, or if symptoms worsen. Has crisis numbers to call if needed. Pt verbalized understanding.  F/up in 4 months or sooner if  needed   Oletta DarterSalina Dominigue Gellner, MD 09/02/2017, 9:11 AM

## 2017-09-24 DIAGNOSIS — D485 Neoplasm of uncertain behavior of skin: Secondary | ICD-10-CM | POA: Diagnosis not present

## 2017-09-24 DIAGNOSIS — D2361 Other benign neoplasm of skin of right upper limb, including shoulder: Secondary | ICD-10-CM | POA: Diagnosis not present

## 2017-12-23 DIAGNOSIS — M950 Acquired deformity of nose: Secondary | ICD-10-CM | POA: Insufficient documentation

## 2017-12-23 DIAGNOSIS — R0683 Snoring: Secondary | ICD-10-CM | POA: Diagnosis not present

## 2017-12-30 ENCOUNTER — Encounter (HOSPITAL_COMMUNITY): Payer: Self-pay | Admitting: Psychiatry

## 2017-12-30 ENCOUNTER — Ambulatory Visit (INDEPENDENT_AMBULATORY_CARE_PROVIDER_SITE_OTHER): Payer: BLUE CROSS/BLUE SHIELD | Admitting: Psychiatry

## 2017-12-30 DIAGNOSIS — Z818 Family history of other mental and behavioral disorders: Secondary | ICD-10-CM

## 2017-12-30 DIAGNOSIS — Z811 Family history of alcohol abuse and dependence: Secondary | ICD-10-CM | POA: Diagnosis not present

## 2017-12-30 DIAGNOSIS — F411 Generalized anxiety disorder: Secondary | ICD-10-CM

## 2017-12-30 DIAGNOSIS — F330 Major depressive disorder, recurrent, mild: Secondary | ICD-10-CM

## 2017-12-30 MED ORDER — FLUOXETINE HCL 10 MG PO CAPS: 20.0000 mg | ORAL_CAPSULE | Freq: Every day | ORAL | 4 refills | Status: DC

## 2017-12-30 NOTE — Progress Notes (Signed)
BH MD/PA/NP OP Progress Note  12/30/2017 9:20 AM Jamie Cross  MRN:  [id number]  Chief Complaint:  Chief Complaint    Follow-up     HPI: Patient reports that she is doing well.  She is currently in her second year [employer].  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  Classes are going well and she has made some friends.  She denies depression.  She does have a few days where she may feel fatigued when over stressed.  She denies anhedonia, crying spells, hopelessness, worthlessness.  Sleep, appetite and energy are good.  Patient denies SI/HI.  Anxiety is overall well controlled.  It is mostly related to school stress but does not interfere with daily functioning in any way.  When she is anxious she is very restless but it usually resolves with some time set aside to complete an assignment.  Patient states that Prozac has been very helpful and she denies side effects.  Visit Diagnosis:    ICD-10-CM   1. Mild episode of recurrent major depressive disorder (HCC) F33.0 FLUoxetine (PROZAC) 10 MG capsule  2. GAD (generalized anxiety disorder) F41.1 FLUoxetine (PROZAC) 10 MG capsule    Past Psychiatric History:  Anxiety: Yes Bipolar Disorder: No Depression: Yes Mania: No Psychosis: No Schizophrenia: No Personality Disorder: No Hospitalization for psychiatric illness: Yes History of Electroconvulsive Shock Therapy: No Prior Suicide Attempts: No   Past Medical History:  Past Medical History:  Diagnosis Date  . Anxiety   . Depression     Past Surgical History:  Procedure Laterality Date  . TONSILLECTOMY      Family Psychiatric History: Family History  Problem Relation Age of Onset  . Alcohol abuse Paternal Aunt   . Depression Maternal Uncle   . Depression Maternal Grandmother   . Anxiety disorder Maternal Grandmother   . Depression Paternal Grandmother   .  Bipolar disorder Other   . Depression Mother   . Anxiety disorder Sister   . Suicidality Neg Hx     Social History:  Social History   Socioeconomic History  . Marital status: Single    Spouse name: Not on file  . Number of children: Not on file  . Years of education: Not on file  . Highest education level: Not on file  Occupational History  . Not on file  Social Needs  . Financial resource strain: Not on file  . Food insecurity:    Worry: Not on file    Inability: Not on file  . Transportation needs:    Medical: Not on file    Non-medical: Not on file  Tobacco Use  . Smoking status: Never Smoker  . Smokeless tobacco: Never Used  Substance and Sexual Activity  . Alcohol use: No  . Drug use: No  . Sexual activity: Yes    Partners: Male    Birth control/protection: Implant, Condom  Lifestyle  . Physical activity:    Days per week: Not on file    Minutes per session: Not on file  . Stress: Not on file  Relationships  . Social connections:    Talks on phone: Not on file    Gets together: Not on file    Attends religious service: Not on file    Active member of club or organization: Not on file    Attends meetings of  clubs or organizations: Not on file    Relationship status: Not on file  Other Topics Concern  . Not on file  Social History Narrative  . Not on file    Allergies: No Known Allergies  Metabolic Disorder Labs: No results found for: HGBA1C, MPG No results found for: PROLACTIN Lab Results  Component Value Date   CHOL 118 04/08/2014   TRIG 91 04/08/2014   HDL 26 (L) 04/08/2014   CHOLHDL 4.5 04/08/2014   VLDL 18 04/08/2014   LDLCALC 74 04/08/2014   Lab Results  Component Value Date   TSH 2.760 04/08/2014    Therapeutic Level Labs: No results found for: LITHIUM No results found for: VALPROATE No components found for:  CBMZ  Current Medications: Current Outpatient Medications  Medication Sig Dispense Refill  . FLUoxetine (PROZAC) 10 MG  capsule Take 2 capsules (20 mg total) by mouth daily. 60 capsule 4   No current facility-administered medications for this visit.      Musculoskeletal: Strength & Muscle Tone: within normal limits Gait & Station: normal Patient leans: N/A  Psychiatric Specialty Exam: Review of Systems  Gastrointestinal: Negative for abdominal pain, constipation, diarrhea, heartburn, nausea and vomiting.  Neurological: Negative for dizziness, tremors, sensory change, speech change and headaches.    Blood pressure 113/81, pulse 93, height 5\' 5"  (1.651 m), weight 118 lb 9.6 oz (53.8 kg), SpO2 100 %.Body mass index is 19.74 kg/m.  General Appearance: Casual  Eye Contact:  Good  Speech:  Clear and Coherent and Normal Rate  Volume:  Normal  Mood:  Euthymic  Affect:  Full Range  Thought Process:  Goal Directed and Descriptions of Associations: Intact  Orientation:  Full (Time, Place, and Person)  Thought Content: Logical   Suicidal Thoughts:  No  Homicidal Thoughts:  No  Memory:  Immediate;   Good Recent;   Good Remote;   Good  Judgement:  Good  Insight:  Good  Psychomotor Activity:  Normal  Concentration:  Concentration: Good and Attention Span: Good  Recall:  Good  Fund of Knowledge: Good  Language: Good  Akathisia:  No  Handed:  Right  AIMS (if indicated): not done  Assets:  Communication Skills Desire for Improvement Financial Resources/Insurance Housing Leisure Time Physical Health Resilience Social Support Talents/Skills Transportation Vocational/Educational  ADL's:  Intact  Cognition: WNL  Sleep:  Good   Screenings:   Assessment and Plan: GAD; MDD-recurrent, mild    Medication management with supportive therapy. Risks and benefits, side effects and alternative treatment options discussed with patient. Pt was given an opportunity to ask questions about medication, illness, and treatment. All current psychiatric medications have been reviewed and discussed with the patient  and adjusted as clinically appropriate. The patient has been provided an accurate and updated list of the medications being now prescribed. Patient expressed understanding of how their medications were to be used.  Pt verbalized understanding and verbal consent obtained for treatment.  Status of current problems: stable  Meds: Prozac 20 mg p.o. daily for MDD and GAD   Labs: none  Therapy: brief supportive therapy provided. Discussed psychosocial stressors in detail.     Consultations: Encouraged to follow up with therapist as needed Encouraged to follow up with PCP as needed  Pt denies SI and is at an acute low risk for suicide. Patient told to call clinic if any problems occur. Patient advised to go to ER if they should develop SI/HI, side effects, or if symptoms worsen. Has crisis numbers to  call if needed. Pt verbalized understanding.  F/up in 4 months or sooner if needed    Oletta Darter, MD 12/30/2017, 9:20 AM

## 2018-02-03 DIAGNOSIS — M9907 Segmental and somatic dysfunction of upper extremity: Secondary | ICD-10-CM | POA: Diagnosis not present

## 2018-02-03 DIAGNOSIS — M9902 Segmental and somatic dysfunction of thoracic region: Secondary | ICD-10-CM | POA: Diagnosis not present

## 2018-04-11 ENCOUNTER — Other Ambulatory Visit: Payer: Self-pay

## 2018-04-11 ENCOUNTER — Other Ambulatory Visit (HOSPITAL_COMMUNITY)
Admission: RE | Admit: 2018-04-11 | Discharge: 2018-04-11 | Disposition: A | Discharge status: Home / Self Care | Payer: BLUE CROSS/BLUE SHIELD | Source: Ambulatory Visit | Attending: Obstetrics & Gynecology | Admitting: Obstetrics & Gynecology

## 2018-04-11 ENCOUNTER — Ambulatory Visit (INDEPENDENT_AMBULATORY_CARE_PROVIDER_SITE_OTHER): Payer: BLUE CROSS/BLUE SHIELD | Admitting: Obstetrics & Gynecology

## 2018-04-11 ENCOUNTER — Encounter: Payer: Self-pay | Admitting: Obstetrics & Gynecology

## 2018-04-11 VITALS — BP 108/76 | HR 80 | Resp 16 | Ht 65.5 in | Wt 115.4 lb

## 2018-04-11 DIAGNOSIS — Z789 Other specified health status: Secondary | ICD-10-CM | POA: Diagnosis not present

## 2018-04-11 DIAGNOSIS — Z124 Encounter for screening for malignant neoplasm of cervix: Secondary | ICD-10-CM | POA: Diagnosis not present

## 2018-04-11 DIAGNOSIS — IMO0001 Reserved for inherently not codable concepts without codable children: Secondary | ICD-10-CM

## 2018-04-11 DIAGNOSIS — Z23 Encounter for immunization: Secondary | ICD-10-CM

## 2018-04-11 DIAGNOSIS — Z01419 Encounter for gynecological examination (general) (routine) without abnormal findings: Secondary | ICD-10-CM

## 2018-04-11 DIAGNOSIS — Z202 Contact with and (suspected) exposure to infections with a predominantly sexual mode of transmission: Secondary | ICD-10-CM

## 2018-04-11 DIAGNOSIS — Z113 Encounter for screening for infections with a predominantly sexual mode of transmission: Secondary | ICD-10-CM | POA: Insufficient documentation

## 2018-04-11 NOTE — Progress Notes (Signed)
22 y.o. G0P0000 SingleCaucasianF here for annual exam.  Has Nexplanon implant.  Typically does not have cycle.  Has experienced minimal spotting with the Nexplanon.    No LMP recorded. Patient has had an implant.          Sexually active: Yes.    The current method of family planning is condoms every time and Nexplanon placed 04/2015.    Exercising: Yes.    high + cardio Smoker:  no  Health Maintenance: Pap:  never TDaP:  04/2008 Gardasil: completed just one Screening Labs: PCP   reports that she has never smoked. She has never used smokeless tobacco. She reports that she drinks about 0.6 - 1.2 oz of alcohol per week. She reports that she has current or past drug history.  Past Medical History:  Diagnosis Date  . Anxiety   . Depression     Past Surgical History:  Procedure Laterality Date  . TONSILLECTOMY      Current Outpatient Medications  Medication Sig Dispense Refill  . FLUoxetine (PROZAC) 20 MG tablet Take 1 tablet by mouth daily.     No current facility-administered medications for this visit.     Family History  Problem Relation Age of Onset  . Alcohol abuse Paternal Aunt   . Depression Maternal Uncle   . Depression Maternal Grandmother   . Anxiety disorder Maternal Grandmother   . Depression Paternal Grandmother   . Bipolar disorder Other   . Depression Mother   . Anxiety disorder Sister   . Suicidality Neg Hx     Review of Systems  All other systems reviewed and are negative.   Exam:   BP 108/76 (BP Location: Right Arm, Patient Position: Sitting, Cuff Size: Normal)   Pulse 80   Resp 16   Ht 5' 5.5" (1.664 m)   Wt 115 lb 6.4 oz (52.3 kg)   BMI 18.91 kg/m    Height: 5' 5.5" (166.4 cm)  Ht Readings from Last 3 Encounters:  04/11/18 5' 5.5" (1.664 m)  02/08/17 5\' 6"  (1.676 m)    General appearance: alert, cooperative and appears stated age Head: Normocephalic, without obvious abnormality, atraumatic Neck: no adenopathy, supple, symmetrical,  trachea midline and thyroid normal to inspection and palpation Lungs: clear to auscultation bilaterally Breasts: normal appearance, no masses or tenderness Heart: regular rate and rhythm Abdomen: soft, non-tender; bowel sounds normal; no masses,  no organomegaly Extremities: extremities normal, atraumatic, no cyanosis or edema Skin: Skin color, texture, turgor normal. No rashes or lesions Lymph nodes: Cervical, supraclavicular, and axillary nodes normal. No abnormal inguinal nodes palpated Neurologic: Grossly normal   Pelvic: External genitalia:  no lesions              Urethra:  normal appearing urethra with no masses, tenderness or lesions              Bartholins and Skenes: normal                 Vagina: normal appearing vagina with normal color and discharge, no lesions              Cervix: no lesions              Pap taken: Yes.   Bimanual Exam:  Uterus:  normal size, contour, position, consistency, mobility, non-tender              Adnexa: normal adnexa and no mass, fullness, tenderness  Rectovaginal: Confirms               Anus:  normal sphincter tone, no lesions  Chaperone was present for exam.  A:  Well Woman with normal exam Nexplanon use H/O depression Acne  P:   Mammogram guidelines reviewed pap smear obtained today GC/Chl/trich testing obtained today as well tdap need to be updated Nexplanon removal and replacement planned for Wednesday return annually or prn

## 2018-04-12 ENCOUNTER — Telehealth: Payer: Self-pay | Admitting: Obstetrics & Gynecology

## 2018-04-12 LAB — CYTOLOGY - PAP
CHLAMYDIA, DNA PROBE: NEGATIVE
Diagnosis: NEGATIVE
Neisseria Gonorrhea: NEGATIVE
TRICH (WINDOWPATH): NEGATIVE

## 2018-04-12 NOTE — Telephone Encounter (Signed)
Call placed to convey benefits. 

## 2018-04-13 ENCOUNTER — Ambulatory Visit (INDEPENDENT_AMBULATORY_CARE_PROVIDER_SITE_OTHER): Payer: BLUE CROSS/BLUE SHIELD | Admitting: Obstetrics & Gynecology

## 2018-04-13 ENCOUNTER — Other Ambulatory Visit: Payer: Self-pay

## 2018-04-13 ENCOUNTER — Encounter: Payer: Self-pay | Admitting: Obstetrics & Gynecology

## 2018-04-13 VITALS — BP 94/62 | HR 80 | Resp 16 | Ht 65.5 in | Wt 115.2 lb

## 2018-04-13 DIAGNOSIS — Z30017 Encounter for initial prescription of implantable subdermal contraceptive: Secondary | ICD-10-CM | POA: Diagnosis not present

## 2018-04-13 DIAGNOSIS — Z01812 Encounter for preprocedural laboratory examination: Secondary | ICD-10-CM | POA: Diagnosis not present

## 2018-04-13 DIAGNOSIS — Z3046 Encounter for surveillance of implantable subdermal contraceptive: Secondary | ICD-10-CM

## 2018-04-13 DIAGNOSIS — Z789 Other specified health status: Secondary | ICD-10-CM

## 2018-04-13 DIAGNOSIS — IMO0001 Reserved for inherently not codable concepts without codable children: Secondary | ICD-10-CM

## 2018-04-13 LAB — POCT URINE PREGNANCY: Preg Test, Ur: NEGATIVE

## 2018-04-13 NOTE — Progress Notes (Deleted)
GYNECOLOGY  VISIT  CC:   ***  HPI: 22 y.o. G0P0000 Single Caucasian female here for Nexplanon exchange.  GYNECOLOGIC HISTORY: No LMP recorded. Patient has had an implant. Contraception: nexplanon Menopausal hormone therapy: none  Patient Active Problem List   Diagnosis Date Noted  . 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 Severe recurrent major depression without psychotic features (HCC) 02/11/2017    Class: Chronic  . Generalized anxiety disorder 02/11/2017    Class: Chronic  . Major depressive disorder, recurrent episode, moderate (HCC) 02/08/2017    Past Medical History:  Diagnosis Date  . Anxiety   . Depression     Past Surgical History:  Procedure Laterality Date  . ADENOIDECTOMY  2000  . TONSILLECTOMY  2008    MEDS:   Current Outpatient Medications on File Prior to Visit  Medication Sig Dispense Refill  . FLUoxetine (PROZAC) 20 MG tablet Take 1 tablet by mouth daily.     No current facility-administered medications on file prior to visit.     ALLERGIES: Patient has no known allergies.  Family History  Problem Relation Age of Onset  . Alcohol abuse Paternal Aunt   . Depression Maternal Uncle   . Depression Maternal Grandmother   . Anxiety disorder Maternal Grandmother   . Depression Paternal Grandmother   . Depression Mother   . Anxiety disorder Sister   . Breast cancer Other        maternal GGM  . Suicidality Neg Hx     SH:  ***  Review of Systems  All other systems reviewed and are negative.   PHYSICAL EXAMINATION:    BP 94/62 (BP Location: Right Arm, Patient Position: Sitting, Cuff Size: Normal)   Pulse 80   Resp 16   Ht 5' 5.5" (1.664 m)   Wt 115 lb 3.2 oz (52.3 kg)   BMI 18.88 kg/m     General appearance: alert, cooperative and appears stated age Neck: no adenopathy, supple, symmetrical, trachea midline and thyroid {CHL AMB PHY EX THYROID NORM DEFAULT:[phone]::"normal to inspection and palpation"} CV:  {Exam; heart brief:31539} Lungs:   {pe lungs ob:314451::"clear to auscultation, no wheezes, rales or rhonchi, symmetric air entry"} Breasts: {Exam; breast:13139::"normal appearance, no masses or tenderness"} Abdomen: soft, non-tender; bowel sounds normal; no masses,  no organomegaly  Pelvic: External genitalia:  no lesions              Urethra:  normal appearing urethra with no masses, tenderness or lesions              Bartholins and Skenes: normal                 Vagina: normal appearing vagina with normal color and discharge, no lesions              Cervix: {CHL AMB PHY EX CERVIX NORM DEFAULT:[phone]::"no lesions"}              Bimanual Exam:  Uterus:  {CHL AMB PHY EX UTERUS NORM DEFAULT:[id number]::"normal size, contour, position, consistency, mobility, non-tender"}              Adnexa: {CHL AMB PHY EX ADNEXA NO MASS DEFAULT:[phone]::"no mass, fullness, tenderness"}              Rectovaginal: {yes no:314532}.  Confirms.              Anus:  normal sphincter tone, no lesions  Chaperone was present for exam.  Assessment: ***  Plan: ***   ~{NUMBERS; -10-45 JOINT ROM:10287}  minutes spent with patient >50% of time was in face to face discussion of above.

## 2018-04-13 NOTE — Progress Notes (Signed)
22 y.o. G0P0000 Caucasian Single female presents for Nexplanon removal and insertion. Options for contraception have been reviewed including risks and benefits.  Pt feels this is best option for her.  She has been very happy with previous Nexplanon and desires to continue  LMP:  Does not cycle very frequently with IUD  Current contraception:  Nexplanon   After all questions were answered, consent was obtained.    Past Medical History:  Diagnosis Date  . Anxiety   . Depression     Past Surgical History:  Procedure Laterality Date  . ADENOIDECTOMY  2000  . TONSILLECTOMY  2008    Current Outpatient Medications on File Prior to Visit  Medication Sig Dispense Refill  . FLUoxetine (PROZAC) 20 MG tablet Take 1 tablet by mouth daily.     No current facility-administered medications on file prior to visit.    No Known Allergies  Vitals:   04/13/18 1009  BP: 94/62  Pulse: 80  Resp: 16   Physical Exam  Constitutional: She appears well-developed and well-nourished.  Skin: Skin is warm and dry.  Psychiatric: She has a normal mood and affect.    Procedure: Patient placed supine on exam table with her left arm flexed at the elbow and externally rotated.  The device was identified.  Area for removal identified.  Arm cleansed with Betadine x 3.  1c 1% Xylocaine instilled beneath most distal edge of device.  Lot:  [id number].  Exp:  01/23.  Skin nicked with #11 blade.  Device visible at incision.  Capsule opened and device easily removed.  Then nnsertion site was identified on the inner side of lower arm.  3 ml of 1% Lidocaine was injected along the track of the insertion site. Lot:  [id number].  Exp:  08/02/2020.  The Nexplanon, under sterile conditions, was inserted in left arm. The implant was palpated in the arm after insertion. A small adhesive bandage placed over insertion site. The patient palpated the device for monitoring of the device.  No active bleeding was noted.  A pressure bandage was  place over the site.  Pt tolerated procedure well.  Assessment: Nexplanon removal and insertion for contraceptive needs  Plan  Pt knows to call back with any questions or concerns.  She is aware of signs/symptoms to watch for over the next few weeks. Pt aware Nexplanon should not be removed any later than 04/13/2021.

## 2018-04-18 NOTE — Telephone Encounter (Signed)
-----   Message from Jerene BearsMary S Miller, MD sent at 04/13/2018 11:04 AM EDT ----- 02 recall.  Please let pt know her pap and GC/Chl/trich testing was negative.

## 2018-04-18 NOTE — Telephone Encounter (Signed)
Called patient re: results.  Unable to leave message. Voicemail full.

## 2018-04-27 DIAGNOSIS — L709 Acne, unspecified: Secondary | ICD-10-CM | POA: Diagnosis not present

## 2018-05-05 ENCOUNTER — Ambulatory Visit (INDEPENDENT_AMBULATORY_CARE_PROVIDER_SITE_OTHER): Payer: BLUE CROSS/BLUE SHIELD | Admitting: Psychiatry

## 2018-05-05 ENCOUNTER — Encounter (HOSPITAL_COMMUNITY): Payer: Self-pay | Admitting: Psychiatry

## 2018-05-05 VITALS — BP 120/86 | HR 74 | Ht 65.5 in | Wt 117.0 lb

## 2018-05-05 DIAGNOSIS — F411 Generalized anxiety disorder: Secondary | ICD-10-CM | POA: Diagnosis not present

## 2018-05-05 DIAGNOSIS — Z811 Family history of alcohol abuse and dependence: Secondary | ICD-10-CM | POA: Diagnosis not present

## 2018-05-05 DIAGNOSIS — F330 Major depressive disorder, recurrent, mild: Secondary | ICD-10-CM | POA: Diagnosis not present

## 2018-05-05 DIAGNOSIS — Z818 Family history of other mental and behavioral disorders: Secondary | ICD-10-CM

## 2018-05-05 MED ORDER — FLUOXETINE HCL 60 MG PO TABS
30.0000 mg | ORAL_TABLET | Freq: Every day | ORAL | 1 refills | Status: DC
Start: 2018-05-05 — End: 2018-07-07

## 2018-05-05 NOTE — Progress Notes (Signed)
BH MD/PA/NP OP Progress Note  05/05/2018 9:18 AM Jamie Cross  MRN:  [id number]  Chief Complaint:  Chief Complaint    Follow-up; Medication Refill     HPI: Patient states that she recently went to the beach and enjoyed the trip tremendously.  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  She is secured some housing after an extensive research and is obtaining her school supplies.  Patient states that while she is having anxiety it is manageable.  Sleep is good appetite and energy are good.  Anxiety is in no way is affecting her quality of life at this point.  Patient states that she is concerned as this is the time of year when her depression usually starts.  Her depression in the past has been so severe that it has affected her quality of life.  She is concerned that while she has no depression symptoms now it will start soon per her previous history.  She denies anhedonia, worthlessness, isolation.  She denies SI/HI.  Patient is a little leery of going up on her dose of Prozac but thinks it will be helpful.  She states that she has been very sensitive to medications in the past but Prozac seems to be more tolerable to her.  Visit Diagnosis:    ICD-10-CM   1. GAD (generalized anxiety disorder) F41.1   2. Mild episode of recurrent major depressive disorder (HCC) F33.0       Past Psychiatric History:  Anxiety: Yes Bipolar Disorder: No Depression: Yes Mania: No Psychosis: No Schizophrenia: No Personality Disorder: No Hospitalization for psychiatric illness: Yes History of Electroconvulsive Shock Therapy: No Prior Suicide Attempts: No   Past Medical History:  Past Medical History:  Diagnosis Date  . Anxiety   . Depression     Past Surgical History:  Procedure Laterality Date  . ADENOIDECTOMY  2000  . TONSILLECTOMY  2008    Family Psychiatric History: Family History  Problem Relation Age  of Onset  . Alcohol abuse Paternal Aunt   . Depression Maternal Uncle   . Depression Maternal Grandmother   . Anxiety disorder Maternal Grandmother   . Depression Paternal Grandmother   . Depression Mother   . Anxiety disorder Sister   . Breast cancer Other        maternal GGM  . Suicidality Neg Hx     Social History:  Social History   Socioeconomic History  . Marital status: Single    Spouse name: Not on file  . Number of children: Not on file  . Years of education: Not on file  . Highest education level: Not on file  Occupational History  . Not on file  Social Needs  . Financial resource strain: Not on file  . Food insecurity:    Worry: Not on file    Inability: Not on file  . Transportation needs:    Medical: Not on file    Non-medical: Not on file  Tobacco Use  . Smoking status: Never Smoker  . Smokeless tobacco: Never Used  Substance and Sexual Activity  . Alcohol use: Yes    Alcohol/week: 1.0 - 2.0 standard drinks    Types: 1 - 2 Standard drinks or equivalent per week  . Drug use: Not Currently  . Sexual activity: Yes    Partners: Male    Birth control/protection: Implant, Condom  Lifestyle  . Physical activity:  Days per week: Not on file    Minutes per session: Not on file  . Stress: Not on file  Relationships  . Social connections:    Talks on phone: Not on file    Gets together: Not on file    Attends religious service: Not on file    Active member of club or organization: Not on file    Attends meetings of clubs or organizations: Not on file    Relationship status: Not on file  Other Topics Concern  . Not on file  Social History Narrative  . Not on file    Allergies: No Known Allergies  Metabolic Disorder Labs: No results found for: HGBA1C, MPG No results found for: PROLACTIN Lab Results  Component Value Date   CHOL 118 04/08/2014   TRIG 91 04/08/2014   HDL 26 (L) 04/08/2014   CHOLHDL 4.5 04/08/2014   VLDL 18 04/08/2014   LDLCALC  74 04/08/2014   Lab Results  Component Value Date   TSH 2.760 04/08/2014    Therapeutic Level Labs: No results found for: LITHIUM No results found for: VALPROATE No components found for:  CBMZ  Current Medications: Current Outpatient Medications  Medication Sig Dispense Refill  . FLUoxetine 60 MG TABS Take 30 mg by mouth daily. 15 tablet 1   No current facility-administered medications for this visit.      Musculoskeletal: Strength & Muscle Tone: within normal limits Gait & Station: normal Patient leans: N/A  Psychiatric Specialty Exam: Physical Exam  Review of Systems  Constitutional: Negative for chills, diaphoresis and fever.  HENT: Negative for congestion, nosebleeds, sinus pain and sore throat.     Blood pressure 120/86, pulse 74, height 5' 5.5" (1.664 m), weight 117 lb (53.1 kg), SpO2 99 %.Body mass index is 19.17 kg/m.  General Appearance: Fairly Groomed  Eye Contact:  Good  Speech:  Clear and Coherent and Normal Rate  Volume:  Normal  Mood:  Euthymic  Affect:  Full Range  Thought Process:  Goal Directed and Descriptions of Associations: Intact  Orientation:  Full (Time, Place, and Person)  Thought Content:  Logical  Suicidal Thoughts:  No  Homicidal Thoughts:  No  Memory:  Immediate;   Good Recent;   Good Remote;   Good  Judgement:  Good  Insight:  Good  Psychomotor Activity:  Normal  Concentration:  Concentration: Good and Attention Span: Good  Recall:  Good  Fund of Knowledge:  Good  Language:  Good  Akathisia:  No  Handed:  Right  AIMS (if indicated):     Assets:  Communication Skills Desire for Improvement Financial Resources/Insurance Housing Leisure Time Physical Health Resilience Social Support Talents/Skills Transportation Vocational/Educational  ADL's:  Intact  Cognition:  WNL  Sleep:    good     Screenings:  I reviewed the information below on 05/05/2018 and agree Assessment and Plan: GAD; MDD-recurrent, mild     Medication management with supportive therapy. Risks and benefits, side effects and alternative treatment options discussed with patient. Pt was given an opportunity to ask questions about medication, illness, and treatment. All current psychiatric medications have been reviewed and discussed with the patient and adjusted as clinically appropriate. The patient has been provided an accurate and updated list of the medications being now prescribed. Patient expressed understanding of how their medications were to be used.  Pt verbalized understanding and verbal consent obtained for treatment.  Status of current problems: stable  Meds: increase Prozac 30 mg p.o. daily for  MDD and GAD- pt reports that this time of the year is when she gets very depressed and wants to manage the symptoms before they become worse   Labs: none  Therapy: brief supportive therapy provided. Discussed psychosocial stressors in detail.     Consultations: Encouraged to follow up with therapist as needed Encouraged to follow up with PCP as needed  Pt denies SI and is at an acute low risk for suicide. Patient told to call clinic if any problems occur. Patient advised to go to ER if they should develop SI/HI, side effects, or if symptoms worsen. Has crisis numbers to call if needed. Pt verbalized understanding.  F/up in 2 months or sooner if needed   Oletta DarterSalina Candance Bohlman, MD 05/05/2018, 9:18 AM

## 2018-07-07 ENCOUNTER — Ambulatory Visit (INDEPENDENT_AMBULATORY_CARE_PROVIDER_SITE_OTHER): Payer: BLUE CROSS/BLUE SHIELD | Admitting: Psychiatry

## 2018-07-07 ENCOUNTER — Encounter (HOSPITAL_COMMUNITY): Payer: Self-pay | Admitting: Psychiatry

## 2018-07-07 VITALS — BP 118/64 | HR 74 | Ht 65.0 in | Wt 117.0 lb

## 2018-07-07 DIAGNOSIS — F330 Major depressive disorder, recurrent, mild: Secondary | ICD-10-CM | POA: Diagnosis not present

## 2018-07-07 DIAGNOSIS — F411 Generalized anxiety disorder: Secondary | ICD-10-CM

## 2018-07-07 MED ORDER — FLUOXETINE HCL 10 MG PO CAPS: 30.0000 mg | ORAL_CAPSULE | Freq: Every day | ORAL | 2 refills | Status: DC

## 2018-07-07 NOTE — Progress Notes (Signed)
BH MD/PA/NP OP Progress Note  07/07/2018 8:34 AM Jamie Cross  MRN:  [id number]  Chief Complaint:  Chief Complaint    Follow-up; Medication Refill     HPI: Jamie Cross states that she is doing well and has no concerns at this time.  She has been very busy with school and midterms occurred recently.  She does get stressed about her classes but states that it is manageable.  She does have on and off anxiety but states that she is coping well and that it is not affecting her quality of life in any way.  She is denying any symptoms of depression.  She states that she is social and denies anhedonia.  Sleep, appetite, energy and concentration are all good.  She denies SI/HI.  She is tolerated the increase in Prozac well.  She does have a history of depression coming on more in the wintertime but feels that she is better prepared to handle it this year.   Visit Diagnosis:    ICD-10-CM   1. GAD (generalized anxiety disorder) F41.1 FLUoxetine (PROZAC) 10 MG capsule  2. MDD (major depressive disorder), recurrent episode, mild (HCC) F33.0 FLUoxetine (PROZAC) 10 MG capsule      Past Psychiatric History:  Anxiety: Yes Bipolar Disorder: No Depression: Yes Mania: No Psychosis: No Schizophrenia: No Personality Disorder: No Hospitalization for psychiatric illness: Yes History of Electroconvulsive Shock Therapy: No Prior Suicide Attempts: No   Past Medical History:  Past Medical History:  Diagnosis Date  . Anxiety   . Depression     Past Surgical History:  Procedure Laterality Date  . ADENOIDECTOMY  2000  . TONSILLECTOMY  2008    Family Psychiatric History: Family History  Problem Relation Age of Onset  . Alcohol abuse Paternal Aunt   . Depression Maternal Uncle   . Depression Maternal Grandmother   . Anxiety disorder Maternal Grandmother   . Depression Paternal Grandmother   . Depression Mother   . Anxiety disorder Sister   . Breast cancer Other        maternal GGM  .  Suicidality Neg Hx     Social History:  Social History   Socioeconomic History  . Marital status: Single    Spouse name: Not on file  . Number of children: Not on file  . Years of education: Not on file  . Highest education level: Not on file  Occupational History  . Not on file  Social Needs  . Financial resource strain: Not on file  . Food insecurity:    Worry: Not on file    Inability: Not on file  . Transportation needs:    Medical: Not on file    Non-medical: Not on file  Tobacco Use  . Smoking status: Never Smoker  . Smokeless tobacco: Never Used  Substance and Sexual Activity  . Alcohol use: Yes    Alcohol/week: 1.0 - 2.0 standard drinks    Types: 1 - 2 Standard drinks or equivalent per week  . Drug use: Not Currently  . Sexual activity: Yes    Partners: Male    Birth control/protection: Implant, Condom  Lifestyle  . Physical activity:    Days per week: Not on file    Minutes per session: Not on file  . Stress: Not on file  Relationships  . Social connections:    Talks on phone: Not on file    Gets together: Not on file    Attends religious service: Not on file  Active member of club or organization: Not on file    Attends meetings of clubs or organizations: Not on file    Relationship status: Not on file  Other Topics Concern  . Not on file  Social History Narrative  . Not on file    Allergies: No Known Allergies  Metabolic Disorder Labs: No results found for: HGBA1C, MPG No results found for: PROLACTIN Lab Results  Component Value Date   CHOL 118 04/08/2014   TRIG 91 04/08/2014   HDL 26 (L) 04/08/2014   CHOLHDL 4.5 04/08/2014   VLDL 18 04/08/2014   LDLCALC 74 04/08/2014   Lab Results  Component Value Date   TSH 2.760 04/08/2014    Therapeutic Level Labs: No results found for: LITHIUM No results found for: VALPROATE No components found for:  CBMZ  Current Medications: Current Outpatient Medications  Medication Sig Dispense Refill   . FLUoxetine (PROZAC) 10 MG capsule Take 3 capsules (30 mg total) by mouth daily. 90 capsule 2   No current facility-administered medications for this visit.      Musculoskeletal: Strength & Muscle Tone: within normal limits Gait & Station: normal Patient leans: N/A  Psychiatric Specialty Exam: Review of Systems  Constitutional: Negative for chills, diaphoresis and fever.  Respiratory: Negative for cough, sputum production and shortness of breath.     Blood pressure 118/64, pulse 74, height 5\' 5"  (1.651 m), weight 117 lb (53.1 kg).Body mass index is 19.47 kg/m.  General Appearance: Fairly Groomed  Eye Contact:  Good  Speech:  Clear and Coherent and Normal Rate  Volume:  Normal  Mood:  Euthymic  Affect:  Full Range  Thought Process:  Goal Directed and Descriptions of Associations: Intact  Orientation:  Full (Time, Place, and Person)  Thought Content:  Logical  Suicidal Thoughts:  No  Homicidal Thoughts:  No  Memory:  Immediate;   Good Recent;   Good Remote;   Good  Judgement:  Good  Insight:  Good  Psychomotor Activity:  Normal  Concentration:  Concentration: Good  Recall:  Good  Fund of Knowledge:  Good  Language:  Good  Akathisia:  No  Handed:  Right  AIMS (if indicated):     Assets:  Communication Skills Desire for Improvement Financial Resources/Insurance Housing Leisure Time Physical Health Resilience Social Support Talents/Skills Transportation Vocational/Educational  ADL's:  Intact  Cognition:  WNL  Sleep:   good     Screenings:   I reviewed the information below on 07/07/2018 and have updated it Assessment and Plan: GAD; MDD-recurrent, mild    Medication management with supportive therapy. Risks and benefits, side effects and alternative treatment options discussed with patient. Pt was given an opportunity to ask questions about medication, illness, and treatment. All current psychiatric medications have been reviewed and discussed with the  patient and adjusted as clinically appropriate. The patient has been provided an accurate and updated list of the medications being now prescribed. Patient expressed understanding of how their medications were to be used.  Pt verbalized understanding and verbal consent obtained for treatment.  Status of current problems: stable-patient reports she typically has some mood symptoms around the holidays.  She wanted to preemptively increase the dose of Prozac at her last visit.  Today she reports that she is doing well and feels ready and able to deal with any symptoms that may come on  Meds: Prozac 30 mg p.o. daily for MDD and GAD   Labs: none  Therapy: brief supportive therapy provided. Discussed  psychosocial stressors in detail.   Encouraged light box therapy for seasonal mood issues  Consultations: Encouraged to follow up with therapist as needed Encouraged to follow up with PCP as needed  Pt denies SI and is at an acute low risk for suicide. Patient told to call clinic if any problems occur. Patient advised to go to ER if they should develop SI/HI, side effects, or if symptoms worsen. Has crisis numbers to call if needed. Pt verbalized understanding.  F/up in 3 months or sooner if needed-per patient preference   Oletta Darter, MD 07/07/2018, 8:34 AM

## 2018-10-06 ENCOUNTER — Ambulatory Visit (HOSPITAL_COMMUNITY): Payer: BLUE CROSS/BLUE SHIELD | Admitting: Psychiatry

## 2018-11-28 DIAGNOSIS — N390 Urinary tract infection, site not specified: Secondary | ICD-10-CM | POA: Diagnosis not present

## 2019-03-07 ENCOUNTER — Telehealth: Payer: Self-pay | Admitting: *Deleted

## 2019-03-07 NOTE — Telephone Encounter (Signed)
Sounds a regular menstrual but I have no idea why she's gone a whole year without bleeding and now has bleeding.  My only suggestion is that if this happens again, would she consider early removal and replacement.  Just make sure she hasn't had any significant weight change.

## 2019-03-07 NOTE — Telephone Encounter (Signed)
Patient has a question about her nexplanon.

## 2019-03-07 NOTE — Telephone Encounter (Signed)
Spoke with patient. Patient is on her 2nd nexplanon, placed 04/13/18. Has not had menses with nexplanon in the past. Reports heavy, but regular menses for the past 2 months on 5/5 x5 days and 6/4 x5 days. Bleeding started again this morning. Bleeding is light right now. SA. has a new partner. Patient is aware irregular bleeding is not uncommon with nexplanon, but is new for her.   Denies pain, N/V, fever/chills, urinary symptoms, d/c or odor. Recommended UPT to r/o pregnancy. Advised will review with Dr. Sabra Heck and return call with additional recommendations, OV may be needed for further evaluation.   Last AEX 04/11/18 Next AEX 08/03/19  Dr. Sabra Heck -please review and advise.

## 2019-03-08 NOTE — Telephone Encounter (Signed)
Left message to call Mirah Nevins, RN at [facility] [phone].   

## 2019-03-08 NOTE — Telephone Encounter (Signed)
Patient returning call.

## 2019-03-08 NOTE — Telephone Encounter (Signed)
Spoke with patient. Patient reports 5-10 lb weight gain in the last couple of months. Reports bleeding has increased today to changing regular tampon q3h. Reports fatigue and menses like cramps. UPT negative. Patient states she is concerned about possibly having menses again and menses being heavy. Denies lightheadedness, dizziness, SHOB. Recommended OV further further evaluation and discussion with Dr. Sabra Heck. OV scheduled for 6/19 at 11:30am with Dr. Sabra Heck. Covid 19 precautions reviewed, prescreen negative. Advised to return call to office if new symptoms develop or bleeding becomes heavy, changing saturated tampon q1-2hrs, ER if after hours. Patient verbalizes understanding and is agreeable.   Routing to provider for final review. Patient is agreeable to disposition. Will close encounter.

## 2019-03-10 ENCOUNTER — Ambulatory Visit (INDEPENDENT_AMBULATORY_CARE_PROVIDER_SITE_OTHER): Payer: BC Managed Care – PPO | Admitting: Obstetrics & Gynecology

## 2019-03-10 ENCOUNTER — Other Ambulatory Visit: Payer: Self-pay

## 2019-03-10 ENCOUNTER — Encounter: Payer: Self-pay | Admitting: Obstetrics & Gynecology

## 2019-03-10 VITALS — BP 110/88 | HR 88 | Temp 98.0°F | Ht 65.0 in | Wt 129.0 lb

## 2019-03-10 DIAGNOSIS — N926 Irregular menstruation, unspecified: Secondary | ICD-10-CM | POA: Diagnosis not present

## 2019-03-10 LAB — POCT URINE PREGNANCY: Preg Test, Ur: NEGATIVE

## 2019-03-10 NOTE — Progress Notes (Signed)
GYNECOLOGY  VISIT  CC:   Irregular bleeding with Nexplanon  HPI: [age] y.o. G0P0000 Single White or Caucasian female here for irregular bleeding and mood changes with Nexplanon.  Had Nexplanon removed and replaced 04/13/18.  She typically has a light bleeding episode 2 or 3 times a year.  After the Nexplanon was removed and replaced, she didn't any bleeding until May.  She had a full-on 5 day heavy cycle.  Then again in June, about 4 weeks (02/23/2019), later had a regular cycle again that was also.  Then 6/16, she started bleeding again.  It is lighter.  She is only using tampons today.    Pap was normal last year 04/11/2018.  She is SA.  She was tested for STDs 04/11/18.  He was tested before they were SA.    GYNECOLOGIC HISTORY: Patient's last menstrual period was 03/07/2019. Contraception: nexplanon placed 04/13/18 Menopausal hormone therapy: none  Patient Active Problem List   Diagnosis Date Noted  . 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 Severe recurrent major depression without psychotic features (HCC) 02/11/2017    Class: Chronic  . Generalized anxiety disorder 02/11/2017    Class: Chronic  . Major depressive disorder, recurrent episode, moderate (HCC) 02/08/2017    Past Medical History:  Diagnosis Date  . Anxiety   . Depression     Past Surgical History:  Procedure Laterality Date  . ADENOIDECTOMY  2000  . TONSILLECTOMY  2008    MEDS:   Current Outpatient Medications on File Prior to Visit  Medication Sig Dispense Refill  . FLUoxetine (PROZAC) 10 MG capsule Take 3 capsules (30 mg total) by mouth daily. 90 capsule 2   No current facility-administered medications on file prior to visit.     ALLERGIES: Patient has no known allergies.  Family History  Problem Relation Age of Onset  . Alcohol abuse Paternal Aunt   . Depression Maternal Uncle   . Depression Maternal Grandmother   . Anxiety disorder Maternal Grandmother   . Depression Paternal Grandmother   . Depression Mother    . Anxiety disorder Sister   . Breast cancer Other        maternal GGM  . Suicidality Neg Hx     SH:  Single, non smoker  Review of Systems  Constitutional: Positive for fatigue and unexpected weight change.  Endocrine:       Acne   Genitourinary: Positive for vaginal bleeding.       Cramping  Psychiatric/Behavioral: Positive for dysphoric mood.  All other systems reviewed and are negative.   PHYSICAL EXAMINATION:    BP 110/88   Pulse 88   Temp 98 F (36.7 C) (Temporal)   Ht 5\' 5"  (1.651 m)   Wt 129 lb (58.5 kg)   LMP 03/07/2019   BMI 21.47 kg/m     General appearance: alert, cooperative and appears stated age Abdomen: soft, non-tender; bowel sounds normal; no masses,  no organomegaly Lymph:  no inguinal LAD noted  Pelvic: External genitalia:  no lesions              Urethra:  normal appearing urethra with no masses, tenderness or lesions              Bartholins and Skenes: normal                 Vagina: normal appearing vagina with normal color and discharge, no lesions              Cervix: no lesions  and scant bleeding present              Bimanual Exam:  Uterus:  normal size, contour, position, consistency, mobility, non-tender              Adnexa: no mass, fullness, tenderness  Chaperone was present for exam.  Assessment: Irregular bleeding for last three months Nexplanon use with almost 4 years prior to May of minimal bleeding  Plan: Check GC/CHl Plan PUS If these are negative/normal, will consider removal of Naxplanon and replacment of next one.  She really does like this method.  We discussed other options and she would have some interest in using [place] IUD as well.  Information provided.

## 2019-03-13 ENCOUNTER — Telehealth: Payer: Self-pay | Admitting: Obstetrics & Gynecology

## 2019-03-13 LAB — GC/CHLAMYDIA PROBE AMP
Chlamydia trachomatis, NAA: NEGATIVE
Neisseria Gonorrhoeae by PCR: NEGATIVE

## 2019-03-13 NOTE — Telephone Encounter (Signed)
Call placed to patient to review benefits for recommended ultrasound and possible Nexplanon exchange. Left voicemail message requesting a return call    cc: Jamie Cross

## 2019-03-14 NOTE — Telephone Encounter (Signed)
Spoke with patient and conveyed benefits for ultrasound and possible Nexplanon exchange. Please call patient for scheduling.

## 2019-03-15 NOTE — Telephone Encounter (Signed)
Encounter closed in error. See telephone encounter dated 03/15/19.

## 2019-03-16 ENCOUNTER — Telehealth: Payer: Self-pay | Admitting: Obstetrics & Gynecology

## 2019-03-16 NOTE — Telephone Encounter (Signed)
Call placed to patient. Confirmed benefit information for an ultrasound with nexplanon exchange. Patient understood information presented and is ready to proceed with scheduling.  Patient is scheduled for ultrasound with nexplanon exchange on 03/23/2019 with Dr Sabra Heck. Patient is aware of appointment date, arrival time and cancellation policy. No further questions.  Forwarding to Dr Sabra Heck for final review. Patient is agreeable to disposition. Will close encounter

## 2019-03-21 ENCOUNTER — Other Ambulatory Visit: Payer: Self-pay

## 2019-03-23 ENCOUNTER — Other Ambulatory Visit: Payer: Self-pay

## 2019-03-23 ENCOUNTER — Ambulatory Visit (INDEPENDENT_AMBULATORY_CARE_PROVIDER_SITE_OTHER): Payer: BC Managed Care – PPO | Admitting: Obstetrics & Gynecology

## 2019-03-23 ENCOUNTER — Encounter: Payer: Self-pay | Admitting: Obstetrics & Gynecology

## 2019-03-23 ENCOUNTER — Ambulatory Visit (INDEPENDENT_AMBULATORY_CARE_PROVIDER_SITE_OTHER): Payer: BC Managed Care – PPO

## 2019-03-23 VITALS — BP 112/70 | HR 88 | Temp 98.1°F | Ht 65.0 in | Wt 131.0 lb

## 2019-03-23 DIAGNOSIS — Z3046 Encounter for surveillance of implantable subdermal contraceptive: Secondary | ICD-10-CM | POA: Diagnosis not present

## 2019-03-23 DIAGNOSIS — Z30017 Encounter for initial prescription of implantable subdermal contraceptive: Secondary | ICD-10-CM | POA: Diagnosis not present

## 2019-03-23 DIAGNOSIS — Z3009 Encounter for other general counseling and advice on contraception: Secondary | ICD-10-CM | POA: Diagnosis not present

## 2019-03-23 DIAGNOSIS — N926 Irregular menstruation, unspecified: Secondary | ICD-10-CM | POA: Diagnosis not present

## 2019-03-23 NOTE — Progress Notes (Signed)
23 y.o. G0P0000 Single White or Caucasian female here for pelvic ultrasound due to irregular bleeding with her Nexplanon.  This is the second device she'd had placed and she had almost no bleeding the first 3 years.  This second device has been different.  She and I discussed proceeding with PUS and if this was normal then removing and replacing her Nexplanon.  She is here for all of this today.  Patient's last menstrual period was 03/07/2019.  Contraception: Nexplanon  Past Medical History:  Diagnosis Date  . Anxiety   . Depression     Past Surgical History:  Procedure Laterality Date  . ADENOIDECTOMY  2000  . TONSILLECTOMY  2008    Current Outpatient Medications on File Prior to Visit  Medication Sig Dispense Refill  . FLUoxetine (PROZAC) 10 MG capsule Take 3 capsules (30 mg total) by mouth daily. 90 capsule 2   No current facility-administered medications on file prior to visit.    No Known Allergies  Vitals:   03/23/19 1345  BP: 112/70  Pulse: 88  Temp: 98.1 F (36.7 C)    Findings:  UTERUS: 6.9 x 3.8 x 3.0cm without fibroids EMS: 5.24mm ADNEXA: Left ovary:  3.2 x 2.3 x 1.6cm with 5.2DP dominant follicle       Right ovary:  2.0 x 2.1 x 1.6cm  CUL DE SAC: no free fluid  Discussion:  Findings reviewed with pt.  She is comfortable proceeding with replacement of Nexplanon.  After all questions were answered, consent was obtained.    Procedure: Patient placed supine on exam table with her left arm flexed at the elbow and externally rotated.  The prior insertion site was noted on the underside of the arm.  The area was cleansed with Betadine x 3.  Then 1cc 1% Lidocaine was instilled.  Small incision was made with a #11 blade.  By compressing the proximal end, the distal end of the device was present at the incision.  The capsule was opened with the #11 blade and the device was easily removed.  Then 3 ml of 1% Lidocaine was injected along the track of the insertion site.  The  Nexplanon, under sterile conditions, was inserted in left arm. The implant was palpated in the arm after insertion. A small adhesive bandage placed over insertion site. The patient palpated the device for monitoring of the device.  No active bleeding was noted.  Steri-strips were placed over the incision and dressing applied.  Pt tolerated procedure well.  Assessment: DUB Desired contraception with nexplanon  Plan  Pt knows to call back with nay questions or concerns.  She is aware of signs/symptoms to watch for over the next few weeks. Pt aware Nexplanon should not be removed any later than 03/22/2022. She is also going to give me an update about her bleeding in 2-3 months.  ~10 minutes spent with patient discussing ultrasound findings before proceeding with the Nexplanon removal and replacement.

## 2019-04-27 ENCOUNTER — Telehealth: Payer: Self-pay | Admitting: Obstetrics & Gynecology

## 2019-04-27 NOTE — Telephone Encounter (Signed)
Patient is calling to give verbal permission to speak with her mother regarding billing and financial correspondence. Routing to [place] to review and close encounter.

## 2019-05-05 DIAGNOSIS — Z20828 Contact with and (suspected) exposure to other viral communicable diseases: Secondary | ICD-10-CM | POA: Diagnosis not present

## 2019-06-22 DIAGNOSIS — Z03818 Encounter for observation for suspected exposure to other biological agents ruled out: Secondary | ICD-10-CM | POA: Diagnosis not present

## 2019-06-22 DIAGNOSIS — Z20828 Contact with and (suspected) exposure to other viral communicable diseases: Secondary | ICD-10-CM | POA: Diagnosis not present

## 2019-06-28 DIAGNOSIS — Z20828 Contact with and (suspected) exposure to other viral communicable diseases: Secondary | ICD-10-CM | POA: Diagnosis not present

## 2019-08-03 ENCOUNTER — Ambulatory Visit (INDEPENDENT_AMBULATORY_CARE_PROVIDER_SITE_OTHER): Payer: BC Managed Care – PPO | Admitting: Obstetrics & Gynecology

## 2019-08-03 ENCOUNTER — Encounter: Payer: Self-pay | Admitting: Obstetrics & Gynecology

## 2019-08-03 ENCOUNTER — Encounter

## 2019-08-03 ENCOUNTER — Other Ambulatory Visit: Payer: Self-pay

## 2019-08-03 VITALS — BP 116/64 | HR 72 | Temp 97.9°F | Resp 12 | Ht 65.25 in | Wt 141.2 lb

## 2019-08-03 DIAGNOSIS — Z01419 Encounter for gynecological examination (general) (routine) without abnormal findings: Secondary | ICD-10-CM | POA: Diagnosis not present

## 2019-08-03 NOTE — Progress Notes (Signed)
23 y.o. G0P0000 Single White or Caucasian female here for annual exam.  Doing well.  Had Nexplanon removed and replaced 03/23/2019.  Had first cycle since July.  Flow was heavy for a day and is now just down to spotting.  Did have have a lot of cramping on Tuesday.    Had GC/Chl 02/2019.  Acne has been better this past year.    Patient's last menstrual period was 07/31/2019.          Sexually active: Yes.    The current method of family planning is Nexplanon.    Exercising: Yes.    workouts Smoker:  no  Health Maintenance: Pap:  04/11/18 Neg History of abnormal Pap:  no TDaP:  04/11/18 Gardasil: Completed Flu:  Did receive one Screening Labs: discuss today if needed   reports that she has never smoked. She has never used smokeless tobacco. She reports current alcohol use of about 1.0 - 2.0 standard drinks of alcohol per week. She reports previous drug use.  Past Medical History:  Diagnosis Date  . Anxiety   . Depression     Past Surgical History:  Procedure Laterality Date  . ADENOIDECTOMY  2000  . TONSILLECTOMY  2008    Current Outpatient Medications  Medication Sig Dispense Refill  . etonogestrel (NEXPLANON) 68 MG IMPL implant 1 each by Subdermal route once.    Marland Kitchen FLUoxetine (PROZAC) 10 MG capsule Take 3 capsules (30 mg total) by mouth daily. 90 capsule 2   No current facility-administered medications for this visit.     Family History  Problem Relation Age of Onset  . Alcohol abuse Paternal Aunt   . Depression Maternal Uncle   . Depression Maternal Grandmother   . Anxiety disorder Maternal Grandmother   . Depression Paternal Grandmother   . Depression Mother   . Anxiety disorder Sister   . Breast cancer Other        maternal GGM  . Suicidality Neg Hx     Review of Systems  All other systems reviewed and are negative.   Exam:   BP 116/64 (BP Location: Right Arm, Patient Position: Sitting, Cuff Size: Normal)   Pulse 72   Temp 97.9 F (36.6 C) (Temporal)    Resp 12   Ht 5' 5.25" (1.657 m)   Wt 141 lb 3.2 oz (64 kg)   LMP 07/31/2019   BMI 23.32 kg/m   Height: 5' 5.25" (165.7 cm)  Ht Readings from Last 3 Encounters:  08/03/19 5' 5.25" (1.657 m)  03/23/19 5\' 5"  (1.651 m)  03/10/19 5\' 5"  (1.651 m)   General appearance: alert, cooperative and appears stated age Head: Normocephalic, without obvious abnormality, atraumatic Neck: no adenopathy, supple, symmetrical, trachea midline and thyroid normal to inspection and palpation Lungs: clear to auscultation bilaterally Breasts: normal appearance, no masses or tenderness Heart: regular rate and rhythm Abdomen: soft, non-tender; bowel sounds normal; no masses,  no organomegaly Extremities: extremities normal, atraumatic, no cyanosis or edema Skin: Skin color, texture, turgor normal. No rashes or lesions Lymph nodes: Cervical, supraclavicular, and axillary nodes normal. No abnormal inguinal nodes palpated Neurologic: Grossly normal   Pelvic: External genitalia:  no lesions              Urethra:  normal appearing urethra with no masses, tenderness or lesions              Bartholins and Skenes: normal  Vagina: normal appearing vagina with normal color and discharge, no lesions              Cervix: no lesions              Pap taken: No. Bimanual Exam:  Uterus:  normal size, contour, position, consistency, mobility, non-tender              Adnexa: normal adnexa and no mass, fullness, tenderness               Rectovaginal: Confirms               Anus:  normal sphincter tone, no lesions  Chaperone was present for exam.  A:  Well Woman with normal exam Nexplanon newly placed 03/23/2019 H/o depression Acne  P:   Mammogram guidelines reviewed.   pap smear neg 2019.  Not indicated today. STD testing 02/2019 Return annually or prn

## 2019-09-10 ENCOUNTER — Other Ambulatory Visit (HOSPITAL_COMMUNITY): Payer: Self-pay | Admitting: Psychiatry

## 2019-09-10 DIAGNOSIS — F411 Generalized anxiety disorder: Secondary | ICD-10-CM

## 2019-09-10 DIAGNOSIS — F330 Major depressive disorder, recurrent, mild: Secondary | ICD-10-CM

## 2019-09-19 ENCOUNTER — Telehealth (HOSPITAL_COMMUNITY): Payer: Self-pay

## 2019-09-19 NOTE — Telephone Encounter (Signed)
Medication management - Telephone call with pt requesting to see Dr. Doyne Keel as soon as possible to get a refill of her past prescribed Fluoxetine.  Discussed with patient she was last seen by Dr. Doyne Keel on 07/07/18 and no showed 10/06/18.  Patient stated when she last met with Dr. Doyne Keel she thought as long as she was dong good she did not need to have and appointment and denies knowing she missed appointment 10/06/18. Patient also reports she has been taking Fluoxetine 10 mg, 3 a day every since that time even though her pharmacy reports medications were last filled  In March 2020 and then August 2020.  Patient stated she had been trying to get in now with Dr. Doyne Keel for awhile and to get a refill.  States at one time was told orders were at her pharmacy but they were not.  Stated when she called back about this she was then told by CMA they had looked at the date wrong and last order was 07/07/18 + 2 refill.  Patient requests to be seen as soon as can be worked into the schedule as wants to be on medications.  Informed Dr. Havery Moros schedule is full until 10/26/19 but could be seen in the Saturday clinic 09/30/19 by Dr. Montel Culver.  Patient given a time for 10:00am that date and agreed to keep the virtual appointment.  Patient to then be put back on Dr. Havery Moros schedule after this Saturday appointment with covering provider on 09/30/19.  Patient to call back that week if does not receive Webex appointment and verified patient's phone number is still [phone] and email address for appointment is [email or web address].

## 2019-09-19 NOTE — Telephone Encounter (Signed)
The only "Issue" with this plan that I see is that Dr. Adele Schilder just asked me to switch clinic dates with him: he would take January 9th and I would be on 16th. I agreed to switch.

## 2019-09-19 NOTE — Telephone Encounter (Signed)
Appointment - Telephone message left for pt that Dr. Adele Schilder would be seeing pt on 09/30/19 at 10:00am instead of Dr. Montel Culver and she should receive the virtual appointment invite prior.  Pt to call back if any issues. Informed pt she would then be switched back to Dr. Doyne Keel after seeing Dr. Adele Schilder on 09/30/19.

## 2019-09-20 NOTE — Telephone Encounter (Signed)
Ok

## 2019-09-28 ENCOUNTER — Other Ambulatory Visit (HOSPITAL_COMMUNITY): Payer: Self-pay | Admitting: Psychiatry

## 2019-09-28 DIAGNOSIS — F330 Major depressive disorder, recurrent, mild: Secondary | ICD-10-CM

## 2019-09-28 DIAGNOSIS — F411 Generalized anxiety disorder: Secondary | ICD-10-CM

## 2019-09-28 MED ORDER — FLUOXETINE HCL 10 MG PO CAPS: 10.0000 mg | ORAL_CAPSULE | Freq: Every day | ORAL | 0 refills | Status: DC

## 2019-09-28 NOTE — Progress Notes (Signed)
Filled Prozac 10mg  qD #30 no refills. appt with me on 10/26/2019

## 2019-09-30 ENCOUNTER — Ambulatory Visit (HOSPITAL_COMMUNITY): Payer: BLUE CROSS/BLUE SHIELD | Admitting: Psychiatry

## 2019-10-26 ENCOUNTER — Other Ambulatory Visit: Payer: Self-pay

## 2019-10-26 ENCOUNTER — Encounter (HOSPITAL_COMMUNITY): Payer: Self-pay | Admitting: Psychiatry

## 2019-10-26 ENCOUNTER — Ambulatory Visit (INDEPENDENT_AMBULATORY_CARE_PROVIDER_SITE_OTHER): Payer: BC Managed Care – PPO | Admitting: Psychiatry

## 2019-10-26 DIAGNOSIS — F330 Major depressive disorder, recurrent, mild: Secondary | ICD-10-CM

## 2019-10-26 DIAGNOSIS — F411 Generalized anxiety disorder: Secondary | ICD-10-CM

## 2019-10-26 MED ORDER — FLUOXETINE HCL 10 MG PO CAPS: 30.0000 mg | ORAL_CAPSULE | Freq: Every day | ORAL | 1 refills | Status: DC

## 2019-10-26 NOTE — Progress Notes (Signed)
Virtual Visit via Telephone Note  I connected with Jamie Cross on 10/26/19 at  3:45 PM EST by telephone and verified that I am speaking with the correct person using two identifiers.  Location: Patient: home Provider: office   I discussed the limitations, risks, security and privacy concerns of performing an evaluation and management service by telephone and the availability of in person appointments. I also discussed with the patient that there may be a patient responsible charge related to this service. The patient expressed understanding and agreed to proceed.   History of Present Illness: Jamie Cross notes her mood is dipping up and down. She feels depressed about 2 days a week. On those days she wakes up in a bad mood, fatigued, anxious, overwhelmed, negative self thoughts. It will last for 1-2 days and then become more manageable. Her sleep is a little worse. She wakes up really early in the morning and can't go back to sleep. She is in her 2nd week of remote school. She is walking and trying to be active. She denies SI/HI. Her anxiety is hard to gauge due to current pandemic restrictions. She is trying to maintain a routine. She is productive but everything feels like a chore. Jamie Cross often feels like she is not going to accomplish all she needs to for the day when she is depressed. She misread her prescription and has only been taking 20mg  of Prozac for several months and believes it is contributing to her lower mood.    Observations/Objective:  General Appearance: unable to assess  Eye Contact:  unable to assess  Speech:  Clear and Coherent and Normal Rate  Volume:  Normal  Mood:  Depressed  Affect:  Full Range  Thought Process:  Goal Directed, Linear and Descriptions of Associations: Intact  Orientation:  Full (Time, Place, and Person)  Thought Content:  Logical  Suicidal Thoughts:  No  Homicidal Thoughts:  No  Memory:  Immediate;   Good  Judgement:  Good  Insight:  Good   Psychomotor Activity:  unable to assess  Concentration:  Concentration: Good  Recall:  Good  Fund of Knowledge:  Good  Language:  Good  Akathisia:  unable to assess  Handed:  Right  AIMS (if indicated):     Assets:  Communication Skills Desire for Improvement Financial Resources/Insurance Housing Leisure Time Physical Health Resilience Social Support Talents/Skills Transportation Vocational/Educational  ADL's:  Unable to assess  Cognition:  WNL  Sleep:        Assessment and Plan: MDD- recurrent, mild; GAD  Increase Prozac 30mg  po qD. She was only taking 20mg .   Follow Up Instructions: In 2 months or sooner if needed   I discussed the assessment and treatment plan with the patient. The patient was provided an opportunity to ask questions and all were answered. The patient agreed with the plan and demonstrated an understanding of the instructions.   The patient was advised to call back or seek an in-person evaluation if the symptoms worsen or if the condition fails to improve as anticipated.  I provided 20 minutes of non-face-to-face time during this encounter.   , MD

## 2019-11-30 ENCOUNTER — Ambulatory Visit: Payer: Medicaid Other | Attending: Internal Medicine

## 2019-11-30 DIAGNOSIS — Z23 Encounter for immunization: Secondary | ICD-10-CM

## 2019-11-30 NOTE — Progress Notes (Signed)
   Covid-19 Vaccination Clinic  Name:  Jamie Cross    MRN: [id number] DOB: [birth year]  11/30/2019  [name] was observed post Covid-19 immunization for 15 minutes without incident. She was provided with Vaccine Information Sheet and instruction to access the V-Safe system.   [name] was instructed to call 911 with any severe reactions post vaccine: Marland Kitchen Difficulty breathing  . Swelling of face and throat  . A fast heartbeat  . A bad rash all over body  . Dizziness and weakness   Immunizations Administered    Name Date Dose VIS Date Route   Pfizer COVID-19 Vaccine 11/30/2019  2:56 PM 0.3 mL 09/01/2019 Intramuscular   Manufacturer: [employer], [employer]   Lot: [id number]   NDC: [address]-5

## 2019-12-25 ENCOUNTER — Ambulatory Visit: Payer: Medicaid Other | Attending: Internal Medicine

## 2019-12-25 DIAGNOSIS — Z23 Encounter for immunization: Secondary | ICD-10-CM

## 2019-12-25 NOTE — Progress Notes (Signed)
   Covid-19 Vaccination Clinic  Name:  Jamie Cross    MRN: [id number] DOB: 07/12/1996  12/25/2019  [name] was observed post Covid-19 immunization for 15 minutes without incident. She was provided with Vaccine Information Sheet and instruction to access the V-Safe system.   [name] was instructed to call 911 with any severe reactions post vaccine: Marland Kitchen Difficulty breathing  . Swelling of face and throat  . A fast heartbeat  . A bad rash all over body  . Dizziness and weakness   Immunizations Administered    Name Date Dose VIS Date Route   Pfizer COVID-19 Vaccine 12/25/2019 10:49 AM 0.3 mL 09/01/2019 Intramuscular   Manufacturer: [employer], [employer]   Lot: [id number]   NDC: [address]-3

## 2019-12-28 ENCOUNTER — Other Ambulatory Visit: Payer: Self-pay

## 2019-12-28 ENCOUNTER — Ambulatory Visit (INDEPENDENT_AMBULATORY_CARE_PROVIDER_SITE_OTHER): Payer: BC Managed Care – PPO | Admitting: Psychiatry

## 2019-12-28 ENCOUNTER — Encounter (HOSPITAL_COMMUNITY): Payer: Self-pay | Admitting: Psychiatry

## 2019-12-28 DIAGNOSIS — F411 Generalized anxiety disorder: Secondary | ICD-10-CM | POA: Diagnosis not present

## 2019-12-28 DIAGNOSIS — F330 Major depressive disorder, recurrent, mild: Secondary | ICD-10-CM

## 2019-12-28 MED ORDER — FLUOXETINE HCL 40 MG PO CAPS: 40.0000 mg | ORAL_CAPSULE | Freq: Every day | ORAL | 0 refills | Status: DC

## 2019-12-28 NOTE — Progress Notes (Signed)
Virtual Visit via Telephone Note  I connected with Jamie Cross on 12/28/19 at  2:00 PM EDT by telephone and verified that I am speaking with the correct person using two identifiers.  Location: Patient: home Provider: office   I discussed the limitations, risks, security and privacy concerns of performing an evaluation and management service by telephone and the availability of in person appointments. I also discussed with the patient that there may be a patient responsible charge related to this service. The patient expressed understanding and agreed to proceed.   History of Present Illness: [name] reports she is doing better since restarting the Prozac. She is feeling good. She is more productive and motivated. She denies SI/HI.  [name] states her anxiety is manageable. It is mostly related to stressors. Her main concern now is that she is always feeling fatigue for at least 2 months. She is sleeping 6-8 hrs. Some days she is "despondant and unmotivated" that could be due to the overwhelming fatigue.  [name] is also noting some weight gain. It could be due to the pandemic restrictions. Her concern is that it could be due to a thyroid issue.   Observations/Objective:  General Appearance: unable to assess  Eye Contact:  unable to assess  Speech:  Clear and Coherent and Normal Rate  Volume:  Normal  Mood:  Anxious and Depressed  Affect:  Full Range  Thought Process:  Goal Directed, Linear and Descriptions of Associations: Intact  Orientation:  Full (Time, Place, and Person)  Thought Content:  Logical  Suicidal Thoughts:  No  Homicidal Thoughts:  No  Memory:  Immediate;   Good  Judgement:  Good  Insight:  Good  Psychomotor Activity: unable to assess  Concentration:  Concentration: Good  Recall:  Good  Fund of Knowledge:  Good  Language:  Good  Akathisia:  unable to assess  Handed:  Right  AIMS (if indicated):     Assets:  Communication Skills Desire for  Improvement Financial Resources/Insurance Housing Leisure Time Resilience Social Support Talents/Skills Transportation Vocational/Educational  ADL's:  unable to assess  Cognition:  WNL  Sleep:         Assessment and Plan: MDD- recurrent, mild; GAD  Status of current symptoms: some improvement in mood and anxiety  Increase Prozac 40mg  po qD   Follow Up Instructions: In 2-3 months or sooner if needed   I discussed the assessment and treatment plan with the patient. The patient was provided an opportunity to ask questions and all were answered. The patient agreed with the plan and demonstrated an understanding of the instructions.   The patient was advised to call back or seek an in-person evaluation if the symptoms worsen or if the condition fails to improve as anticipated.  I provided 15 minutes of non-face-to-face time during this encounter.   , MD

## 2020-02-13 ENCOUNTER — Ambulatory Visit (INDEPENDENT_AMBULATORY_CARE_PROVIDER_SITE_OTHER): Payer: BC Managed Care – PPO | Admitting: Family Medicine

## 2020-02-13 ENCOUNTER — Ambulatory Visit: Payer: Self-pay

## 2020-02-13 ENCOUNTER — Other Ambulatory Visit: Payer: Self-pay

## 2020-02-13 ENCOUNTER — Encounter: Payer: Self-pay | Admitting: Family Medicine

## 2020-02-13 VITALS — BP 110/84 | HR 82 | Ht 65.25 in | Wt 158.0 lb

## 2020-02-13 DIAGNOSIS — M25561 Pain in right knee: Secondary | ICD-10-CM | POA: Diagnosis not present

## 2020-02-13 NOTE — Progress Notes (Signed)
Subjective:    CC: R knee pain  I, Jamie Cross, LAT, ATC, am serving as scribe for Dr. Clementeen Graham.  HPI: Pt is a 24 y/o female presenting w/ c/o R knee pain x one week w/ no known MOI.  She reports that has recently returned to the gym and typically does the elliptical and upper/lower body weight machines.  She locates her pain to her R medial patellar tendon.  She rates her pain as mild and describes her pain as sharp and sore.  R knee swelling: No R knee mechanical symptoms: Yes occasional popping no locking or catching. Aggravating factors: weight-bearing rotation/twisting; after working out at the gym Treatments tried: ice, compression, IBU  Pertinent review of Systems: No fevers or chills  Relevant historical information: History depression   Objective:    Vitals:   02/13/20 0923  BP: 110/84  Pulse: 82  SpO2: 98%   General: Well Developed, well nourished, and in no acute distress.   MSK: Right knee normal-appearing Normal range of motion without crepitation. Nontender. Stable ligamentous exam. Negative Murray's test. Intact strength.  Right hip normal-appearing normal motion.  Decreased strength to hip abduction at 4/5.  Leg lengths equal  Lab and Radiology Results  Diagnostic Limited MSK Ultrasound of: Right knee Quad tendon normal-appearing No joint effusion. Patellar tendon slight hypoechoic change at proximal tendon origin at inferior patellar pole and slight hypoechoic change inferior to patellar tendon at distal insertion on the tibia consistent with minimal bursitis Medial joint line normal-appearing with no obvious tear or degeneration. Lateral joint line normal. Posterior joint normal-appearing with no Baker's cyst. Impression: Mild patellar tendinitis normal-appearing otherwise    Impression and Recommendations:    Assessment and Plan: 24 y.o. female with right knee pain ongoing for about a week occurring soon after she resumed  exercise.  Exam and ultrasound concerning for extensor mechanism related pain.  She does have some minimal changes on ultrasound suggestive of patellar tendinitis however I think the most likely issue here is patellofemoral pain syndrome.  She does have a week hip abductors.  Plan for home exercise program as taught in clinic today by ATC with referral to physical therapy if not improving soon.  Also recommend Voltaren gel.  Recheck 6 weeks. Of note she does have some occasional mechanical symptoms.  Medial meniscus tear is not possible however less likely at this time.  [id number]; 15 additional minutes spent for Therapeutic exercises as stated in above notes.  This included exercises focusing on stretching, strengthening, with significant focus on eccentric aspects.   Long term goals include an improvement in range of motion, strength, endurance as well as avoiding reinjury. Patient's frequency would include in 1-2 times a day, 3-5 times a week for a duration of 6-12 weeks.  Proper technique shown and discussed handout in great detail with ATC.  All questions were discussed and answered.   Orders Placed This Encounter  Procedures  . [place] LIMITED JOINT SPACE STRUCTURES LOW RIGHT(NO LINKED CHARGES)    Order Specific Question:   Reason for Exam (SYMPTOM  OR DIAGNOSIS REQUIRED)    Answer:   R knee pain    Order Specific Question:   Preferred imaging location?    Answer:   [facility]   No orders of the defined types were placed in this encounter.   Discussed warning signs or symptoms. Please see discharge instructions. Patient expresses understanding.   The above documentation has been reviewed and is accurate  and complete Lynne Leader, M.D.

## 2020-02-13 NOTE — Patient Instructions (Addendum)
Thank you for coming in today. I think this is patellofemoral pain syndrome.  Could also be a meniscus injury.  Plan to treat with voltaren gel up to 4x daily.  Do the home exercises.  If not improving let me know and I will order formal PT.  Recheck back in 6 weeks or sooner if needed   Please perform the exercise program that we have prepared for you and gone over in detail on a daily basis.  In addition to the handout you were provided you can access your program through: [email or web address]   Your unique program code is:  [facility]

## 2020-03-21 ENCOUNTER — Other Ambulatory Visit: Payer: Self-pay

## 2020-03-21 ENCOUNTER — Telehealth (INDEPENDENT_AMBULATORY_CARE_PROVIDER_SITE_OTHER): Payer: BC Managed Care – PPO | Admitting: Psychiatry

## 2020-03-21 DIAGNOSIS — F330 Major depressive disorder, recurrent, mild: Secondary | ICD-10-CM

## 2020-03-21 DIAGNOSIS — F411 Generalized anxiety disorder: Secondary | ICD-10-CM | POA: Diagnosis not present

## 2020-03-21 MED ORDER — FLUOXETINE HCL 40 MG PO CAPS: 40.0000 mg | ORAL_CAPSULE | Freq: Every day | ORAL | 0 refills | Status: DC

## 2020-03-21 NOTE — Progress Notes (Signed)
Virtual Visit via Telephone Note  I connected with Jamie Cross on 03/21/20 at  4:30 PM EDT by telephone and verified that I am speaking with the correct person using two identifiers.  Location: Patient: home Provider: office   I discussed the limitations, risks, security and privacy concerns of performing an evaluation and management service by telephone and the availability of in person appointments. I also discussed with the patient that there may be a patient responsible charge related to this service. The patient expressed understanding and agreed to proceed.   History of Present Illness: Jamie Cross is noting that the Prozac 40mg  has helped. She is sleeping better and she has a little more energy. Her mood seems more stable. On bad days she has trouble with motivating herself and focusing. Her anxiety about graduating and making future plans is more manageable. Overall she feels she is productive. She denies SI/HI.    Observations/Objective:  General Appearance: unable to assess  Eye Contact:  unable to assess  Speech:  Clear and Coherent and Normal Rate  Volume:  Normal  Mood:  Euthymic  Affect:  Full Range  Thought Process:  Goal Directed and Descriptions of Associations: Intact  Orientation:  Full (Time, Place, and Person)  Thought Content:  Logical  Suicidal Thoughts:  No  Homicidal Thoughts:  No  Memory:  Immediate;   Good  Judgement:  Good  Insight:  Good  Psychomotor Activity: unable to assess  Concentration:  Concentration: Good  Recall:  Good  Fund of Knowledge:  Good  Language:  Good  Akathisia:  unable to assess  Handed:  Right  AIMS (if indicated):     Assets:  Communication Skills Desire for Improvement Financial Resources/Insurance Housing Leisure Time Physical Health Resilience Social Support Talents/Skills Transportation Vocational/Educational  ADL's:  unable to assess  Cognition:  WNL  Sleep:         Assessment and Plan: MDD-  recurrent, mild, GAD  Continue Prozac 40mg  po qD  Follow Up Instructions: In 2-3 months or sooner if needed   I discussed the assessment and treatment plan with the patient. The patient was provided an opportunity to ask questions and all were answered. The patient agreed with the plan and demonstrated an understanding of the instructions.   The patient was advised to call back or seek an in-person evaluation if the symptoms worsen or if the condition fails to improve as anticipated.  I provided 15 minutes of non-face-to-face time during this encounter.   , MD

## 2020-03-26 ENCOUNTER — Ambulatory Visit: Payer: BC Managed Care – PPO | Admitting: Family Medicine

## 2020-06-27 ENCOUNTER — Other Ambulatory Visit: Payer: Self-pay

## 2020-06-27 ENCOUNTER — Telehealth (INDEPENDENT_AMBULATORY_CARE_PROVIDER_SITE_OTHER): Payer: BC Managed Care – PPO | Admitting: Psychiatry

## 2020-06-27 DIAGNOSIS — F411 Generalized anxiety disorder: Secondary | ICD-10-CM | POA: Diagnosis not present

## 2020-06-27 DIAGNOSIS — F330 Major depressive disorder, recurrent, mild: Secondary | ICD-10-CM | POA: Diagnosis not present

## 2020-06-27 MED ORDER — FLUOXETINE HCL 40 MG PO CAPS: 40.0000 mg | ORAL_CAPSULE | Freq: Every day | ORAL | 0 refills | Status: DC

## 2020-06-27 NOTE — Progress Notes (Signed)
Virtual Visit via Telephone Note  I connected with Jamie Cross on 06/27/20 at 11:00 AM EDT by telephone and verified that I am speaking with the correct person using two identifiers.  Location: Patient: home Provider: office   I discussed the limitations, risks, security and privacy concerns of performing an evaluation and management service by telephone and the availability of in person appointments. I also discussed with the patient that there may be a patient responsible charge related to this service. The patient expressed understanding and agreed to proceed.   History of Present Illness: "No real changes. I feel stable". She states she has nothing to report and overall doing well. Jamie Cross denies any overwhelming depression and anxiety symptoms. 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 Her hours run late into the night and it takes her a while to wind down after she comes home. She denies SI/HI. The Prozac remains effective.    Observations/Objective:  General Appearance: unable to assess  Eye Contact:  unable to assess  Speech:  Clear and Coherent and Normal Rate  Volume:  Normal  Mood:  Euthymic  Affect:  Full Range  Thought Process:  Goal Directed, Linear and Descriptions of Associations: Intact  Orientation:  Full (Time, Place, and Person)  Thought Content:  Logical  Suicidal Thoughts:  No  Homicidal Thoughts:  No  Memory:  Immediate;   Good  Judgement:  Good  Insight:  Good  Psychomotor Activity: unable to assess  Concentration:  Concentration: Good  Recall:  Good  Fund of Knowledge:  Good  Language:  Good  Akathisia:  unable to assess  Handed:  Right  AIMS (if indicated):     Assets:  Communication Skills Desire for Improvement Financial Resources/Insurance Housing Leisure Time Physical Health Resilience Social Support Talents/Skills Transportation Vocational/Educational  ADL's:  unable to assess  Cognition:  WNL  Sleep:          Assessment and Plan:  MDD- recurrent, mild; GAD  Prozac 40mg  po qD  Discussed OTC options for sleep- chamomile tea, melatonin, benadryl  Follow Up Instructions: In 3-4 months or sooner if needed   I discussed the assessment and treatment plan with the patient. The patient was provided an opportunity to ask questions and all were answered. The patient agreed with the plan and demonstrated an understanding of the instructions.   The patient was advised to call back or seek an in-person evaluation if the symptoms worsen or if the condition fails to improve as anticipated.  I provided 10 minutes of non-face-to-face time during this encounter.   , MD

## 2020-09-26 ENCOUNTER — Other Ambulatory Visit: Payer: Self-pay

## 2020-09-26 ENCOUNTER — Telehealth (HOSPITAL_BASED_OUTPATIENT_CLINIC_OR_DEPARTMENT_OTHER): Payer: 59 | Admitting: Psychiatry

## 2020-09-26 DIAGNOSIS — F411 Generalized anxiety disorder: Secondary | ICD-10-CM | POA: Diagnosis not present

## 2020-09-26 DIAGNOSIS — F330 Major depressive disorder, recurrent, mild: Secondary | ICD-10-CM

## 2020-09-26 MED ORDER — FLUOXETINE HCL 40 MG PO CAPS: 40.0000 mg | ORAL_CAPSULE | Freq: Every day | ORAL | 0 refills | Status: DC

## 2020-09-26 NOTE — Progress Notes (Signed)
Virtual Visit via Video Note  I connected with Jamie Cross on 09/26/2020 by a video enabled telemedicine application and verified that I am speaking with the correct person using two identifiers.  Location: Patient: home Provider: office   I discussed the limitations of evaluation and management by telemedicine and the availability of in person appointments. The patient expressed understanding and agreed to proceed.  History of Present Illness: Jamie Cross has taken Prozac for one month. Her pharmacy is charging $40 co pay. Her depression is strange so far. She is feeling less motivated and able to do much.  More emotional and started feeling increased anxiety. Jamie Cross has decent sleep. She denies SI/HI.  Observations/Objective: Psychiatric Specialty Exam: ROS  There were no vitals taken for this visit.There is no height or weight on file to calculate BMI.  General Appearance: Casual and Neat  Eye Contact:  Good  Speech:  Clear and Coherent and Normal Rate  Volume:  Normal  Mood:  Depressed  Affect:  Full Range  Thought Process:  Goal Directed, Linear, and Descriptions of Associations: Intact  Orientation:  Full (Time, Place, and Person)  Thought Content:  Logical  Suicidal Thoughts:  No  Homicidal Thoughts:  No  Memory:  Immediate;   Good  Judgement:  Good  Insight:  Good  Psychomotor Activity:  Normal  Concentration:  Concentration: Good  Recall:  Good  Fund of Knowledge:  Good  Language:  Good  Akathisia:  No  Handed:  Right  AIMS (if indicated):     Assets:  Communication Skills Desire for Improvement Financial Resources/Insurance Housing Resilience Social Support Talents/Skills Transportation Vocational/Educational  ADL's:  Intact  Cognition:  WNL  Sleep:        Assessment and Plan: 1. MDD (major depressive disorder), recurrent episode, mild (HCC)  2. GAD (generalized anxiety disorder)   Restart Prozac for mood and anxiety   Follow Up  Instructions: In 4-6 weeks or sooner if needed   I discussed the assessment and treatment plan with the patient. The patient was provided an opportunity to ask questions and all were answered. The patient agreed with the plan and demonstrated an understanding of the instructions.   The patient was advised to call back or seek an in-person evaluation if the symptoms worsen or if the condition fails to improve as anticipated.  I provided 10 minutes of non-face-to-face time during this encounter.   Oletta Darter, MD

## 2020-10-25 ENCOUNTER — Ambulatory Visit: Payer: BC Managed Care – PPO

## 2020-11-14 ENCOUNTER — Telehealth (INDEPENDENT_AMBULATORY_CARE_PROVIDER_SITE_OTHER): Payer: 59 | Admitting: Psychiatry

## 2020-11-14 ENCOUNTER — Other Ambulatory Visit: Payer: Self-pay

## 2020-11-14 DIAGNOSIS — F411 Generalized anxiety disorder: Secondary | ICD-10-CM

## 2020-11-14 DIAGNOSIS — F330 Major depressive disorder, recurrent, mild: Secondary | ICD-10-CM | POA: Diagnosis not present

## 2020-11-14 MED ORDER — FLUOXETINE HCL 40 MG PO CAPS: 40.0000 mg | ORAL_CAPSULE | Freq: Every day | ORAL | 0 refills | Status: DC

## 2020-11-14 NOTE — Progress Notes (Signed)
Virtual Visit via Telephone Note  I connected with Torey Regan Postiglione on 11/14/20 at  9:00 AM EST by telephone and verified that I am speaking with the correct person using two identifiers.  Location: Patient: home Provider: office   I discussed the limitations, risks, security and privacy concerns of performing an evaluation and management service by telephone and the availability of in person appointments. I also discussed with the patient that there may be a patient responsible charge related to this service. The patient expressed understanding and agreed to proceed.   History of Present Illness: Chrisanna shares that her depression and anxiety are manageable with the Prozac. Her anxiety is the driving force behind the depression. Her anxiety makes her feel out of control which makes her depression. With the Prozac the anxiety is not so overwhelming so the depression is better as well. Her sleep is good. Her anhedonia and isolation are improving. This is typical for the winter and typically improves as the days get longer. Sheryle denies SI/HI. The Prozac initially caused nausea but she is now taking it at bedtime and it helps.    Observations/Objective:  General Appearance: unable to assess  Eye Contact:  unable to assess  Speech:  Clear and Coherent and Normal Rate  Volume:  Normal  Mood:  Euthymic  Affect:  Full Range  Thought Process:  Goal Directed, Linear and Descriptions of Associations: Intact  Orientation:  Full (Time, Place, and Person)  Thought Content:  Logical  Suicidal Thoughts:  No  Homicidal Thoughts:  No  Memory:  Immediate;   Good  Judgement:  Good  Insight:  Good  Psychomotor Activity: unable to assess  Concentration:  Concentration: Good  Recall:  Good  Fund of Knowledge:  Good  Language:  Good  Akathisia:  unable to assess  Handed:  Right  AIMS (if indicated):     Assets:  Communication Skills Desire for Improvement Financial  Resources/Insurance Housing Leisure Time Physical Health Resilience Social Support Talents/Skills Transportation Vocational/Educational  ADL's:  unable to assess  Cognition:  WNL  Sleep:         Assessment and Plan: 1. GAD (generalized anxiety disorder) - FLUoxetine (PROZAC) 40 MG capsule; Take 1 capsule (40 mg total) by mouth daily.  Dispense: 90 capsule; Refill: 0  2. MDD (major depressive disorder), recurrent episode, mild (HCC) - FLUoxetine (PROZAC) 40 MG capsule; Take 1 capsule (40 mg total) by mouth daily.  Dispense: 90 capsule; Refill: 0    Follow Up Instructions: In 3 months or sooner if needed   I discussed the assessment and treatment plan with the patient. The patient was provided an opportunity to ask questions and all were answered. The patient agreed with the plan and demonstrated an understanding of the instructions.   The patient was advised to call back or seek an in-person evaluation if the symptoms worsen or if the condition fails to improve as anticipated.  I provided 7 minutes of non-face-to-face time during this encounter.   Oletta Darter, MD

## 2021-02-06 ENCOUNTER — Other Ambulatory Visit: Payer: Self-pay

## 2021-02-06 ENCOUNTER — Telehealth (INDEPENDENT_AMBULATORY_CARE_PROVIDER_SITE_OTHER): Payer: 59 | Admitting: Psychiatry

## 2021-02-06 DIAGNOSIS — F411 Generalized anxiety disorder: Secondary | ICD-10-CM | POA: Diagnosis not present

## 2021-02-06 DIAGNOSIS — F330 Major depressive disorder, recurrent, mild: Secondary | ICD-10-CM | POA: Diagnosis not present

## 2021-02-06 MED ORDER — FLUOXETINE HCL 40 MG PO CAPS: 40.0000 mg | ORAL_CAPSULE | Freq: Every day | ORAL | 0 refills | Status: DC

## 2021-02-06 NOTE — Progress Notes (Signed)
Virtual Visit via Telephone Note  I connected with Jamie Cross on 02/06/21 at 11:00 AM EDT by telephone and verified that I am speaking with the correct person using two identifiers.  Location: Patient: home Provider: office   I discussed the limitations, risks, security and privacy concerns of performing an evaluation and management service by telephone and the availability of in person appointments. I also discussed with the patient that there may be a patient responsible charge related to this service. The patient expressed understanding and agreed to proceed.   History of Present Illness: Jamie Cross has COVID and is currently in quarantine. Her depression has been "pretty good". She has been taking her meds. She got a new job and likes it. Her stress level has gone down. The last 2 weeks her depression has been minimal. She notes that if she is overwhelmed then her depression comes on strong. She denies anhedonia. She denies SI/HI. Jamie Cross has anxiety but she isn't sure if it is GAD or just a response to global and family stressors. She has some trouble falling or staying asleep but it has not been bad. Her appetite is ok.    Observations/Objective:  General Appearance: unable to assess  Eye Contact:  unable to assess  Speech:  Clear and Coherent and Normal Rate  Volume:  Normal  Mood:  Euthymic  Affect:  Full Range  Thought Process:  Goal Directed, Linear and Descriptions of Associations: Intact  Orientation:  Full (Time, Place, and Person)  Thought Content:  Logical  Suicidal Thoughts:  No  Homicidal Thoughts:  No  Memory:  Immediate;   Good  Judgement:  Good  Insight:  Good  Psychomotor Activity: unable to assess  Concentration:  Concentration: Good  Recall:  Good  Fund of Knowledge:  Good  Language:  Good  Akathisia:  unable to assess  Handed:  Right  AIMS (if indicated):     Assets:  Communication Skills Desire for Improvement Financial  Resources/Insurance Housing Leisure Time Physical Health Resilience Social Support Talents/Skills Transportation Vocational/Educational  ADL's:  unable to assess  Cognition:  WNL  Sleep:         Assessment and Plan: Depression screen [facility] 2/9 02/06/2021  Decreased Interest 0  Down, Depressed, Hopeless 1  PHQ - 2 Score 1   Flowsheet Row Video Visit from 02/06/2021 in [facility]  C-SSRS RISK CATEGORY No Risk      - she plans to find a therapist soon  1. GAD (generalized anxiety disorder) - FLUoxetine (PROZAC) 40 MG capsule; Take 1 capsule (40 mg total) by mouth daily.  Dispense: 90 capsule; Refill: 0  2. MDD (major depressive disorder), recurrent episode, mild (HCC) - FLUoxetine (PROZAC) 40 MG capsule; Take 1 capsule (40 mg total) by mouth daily.  Dispense: 90 capsule; Refill: 0    Follow Up Instructions: In 3 months or sooner if needed   I discussed the assessment and treatment plan with the patient. The patient was provided an opportunity to ask questions and all were answered. The patient agreed with the plan and demonstrated an understanding of the instructions.   The patient was advised to call back or seek an in-person evaluation if the symptoms worsen or if the condition fails to improve as anticipated.  I provided 8 minutes of non-face-to-face time during this encounter.   Oletta Darter, MD

## 2021-05-01 ENCOUNTER — Telehealth (HOSPITAL_COMMUNITY): Payer: 59 | Admitting: Psychiatry

## 2021-09-19 ENCOUNTER — Encounter (HOSPITAL_COMMUNITY): Payer: Self-pay | Admitting: *Deleted

## 2021-09-19 ENCOUNTER — Other Ambulatory Visit: Payer: Self-pay

## 2021-09-19 ENCOUNTER — Ambulatory Visit (HOSPITAL_COMMUNITY)
Admission: EM | Admit: 2021-09-19 | Discharge: 2021-09-19 | Disposition: A | Discharge status: Home / Self Care | Payer: 59 | Attending: Internal Medicine | Admitting: Internal Medicine

## 2021-09-19 DIAGNOSIS — J029 Acute pharyngitis, unspecified: Secondary | ICD-10-CM | POA: Diagnosis present

## 2021-09-19 LAB — POCT RAPID STREP A, ED / UC: Streptococcus, Group A Screen (Direct): NEGATIVE

## 2021-09-19 MED ORDER — LIDOCAINE VISCOUS HCL 2 % MT SOLN: 15.0000 mL | Freq: Four times a day (QID) | OROMUCOSAL | 0 refills | Status: DC | PRN

## 2021-09-19 MED ORDER — CEPACOL SORE THROAT SPRAY 0.1-33 % MT LIQD
1.0000 | OROMUCOSAL | 0 refills | Status: DC | PRN
Start: 2021-09-19 — End: 2022-03-17

## 2021-09-19 NOTE — ED Provider Notes (Signed)
[facility]    CSN: [id number] Arrival date & time: 09/19/21  9242      History   Chief Complaint Chief Complaint  Patient presents with   Fever   Cough    HPI Jamie Cross is a 25 y.o. female comes to the urgent care with a 3-day history of sore throat and a fever of 101 Fahrenheit.  Symptoms started with a sore throat 3 days ago.  She describes the sore throat as severe and aggravated by drinking or swallowing.  No known relieving factors.  Patient denies any shortness of breath, nausea or vomiting.  No chest tightness or wheezing.  No sick contacts.  No dizziness, near syncope or syncopal episodes.  Patient tested negative for COVID using the home COVID test.  She tested negative twice for COVID.  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 Authorizing Provider  etonogestrel (NEXPLANON) 68 MG IMPL implant 1 each by Subdermal route once.    [provider]  FLUoxetine (PROZAC) 40 MG capsule Take 1 capsule (40 mg total) by mouth daily. 02/06/21   Oletta Darter, MD  Multiple Vitamin (MULTIVITAMIN) tablet Take 1 tablet by mouth daily.    [provider]   spironolactone (ALDACTONE) 25 MG tablet Take 25 mg by mouth daily.    [provider]    Family History Family History  Problem Relation Age of Onset   Alcohol abuse Paternal Aunt    Depression Maternal Uncle    Depression Maternal Grandmother    Anxiety disorder Maternal Grandmother    Depression Paternal Grandmother    Depression Mother    Anxiety disorder Sister    Breast cancer Other        maternal GGM   Suicidality Neg Hx     Social History Social History   Tobacco Use   Smoking status: Never   Smokeless tobacco: Never  Vaping Use   Vaping Use: Never used  Substance Use Topics   Alcohol use: Yes    Alcohol/week: 1.0 - 2.0 standard drink    Types: 1 - 2 Standard drinks or equivalent per week   Drug use: Not Currently     Allergies   Patient has no known allergies.   Review of Systems Review of Systems As per HPI  Physical Exam Triage Vital Signs ED Triage Vitals  Enc Vitals Group  BP 09/19/21 1153 (!) 132/93     Pulse Rate 09/19/21 1153 81     Resp 09/19/21 1153 18     Temp 09/19/21 1153 98.3 F (36.8 C)     Temp src --      SpO2 09/19/21 1153 98 %     Weight --      Height --      Head Circumference --      Peak Flow --      Pain Score 09/19/21 1150 6     Pain Loc --      Pain Edu? --      Excl. in GC? --    No data found.  Updated Vital Signs BP (!) 132/93    Pulse 81    Temp 98.3 F (36.8 C)    Resp 18    SpO2 98%   Visual Acuity Right Eye Distance:   Left Eye Distance:   Bilateral Distance:    Right Eye Near:   Left Eye Near:    Bilateral Near:     Physical Exam Vitals and nursing note reviewed.  Constitutional:      General: She is not in acute distress.    Appearance: She is not ill-appearing.  HENT:     Mouth/Throat:     Mouth: Mucous membranes are moist.     Pharynx: Posterior oropharyngeal erythema present.  Cardiovascular:     Rate and Rhythm: Normal rate and regular rhythm.  Pulmonary:     Effort:  Respiratory distress present.     Breath sounds: Stridor present.  Neurological:     Mental Status: She is alert.     UC Treatments / Results  Labs (all labs ordered are listed, but only abnormal results are displayed) Labs Reviewed  CULTURE, GROUP A STREP [facility])  POCT RAPID STREP A, ED / UC    EKG   Radiology No results found.  Procedures Procedures (including critical care time)  Medications Ordered in UC Medications - No data to display  Initial Impression / Assessment and Plan / UC Course  I have reviewed the triage vital signs and the nursing notes.  Pertinent labs & imaging results that were available during my care of the patient were reviewed by me and considered in my medical decision making (see chart for details).     1.  Acute viral pharyngitis: Point-of-care strep is negative Throat cultures have been sent Warm salt water gargle Tylenol/Motrin as needed for pain and/or fever Chloraseptic throat spray Lidocaine viscous as needed for throat pain Increase oral fluid intake Return to urgent care if symptoms worsen. Final Clinical Impressions(s) / UC Diagnoses   Final diagnoses:  Acute viral pharyngitis     Discharge Instructions      Strep test is negative Please take medications as prescribed Increase oral fluid intake You may take Tylenol/Motrin as needed for pain and/or fever Return to urgent care if symptoms worsen.     ED Prescriptions   None    PDMP not reviewed this encounter.   Merrilee Jansky, MD 09/19/21 1255

## 2021-09-19 NOTE — Discharge Instructions (Signed)
Strep test is negative Please take medications as prescribed Increase oral fluid intake You may take Tylenol/Motrin as needed for pain and/or fever Return to urgent care if symptoms worsen.

## 2021-09-19 NOTE — ED Triage Notes (Signed)
Pt reports sore throat for 3 days  first day pt reported having a fever.

## 2021-09-21 LAB — CULTURE, GROUP A STREP (THRC)

## 2021-09-22 DIAGNOSIS — R69 Illness, unspecified: Secondary | ICD-10-CM | POA: Diagnosis not present

## 2021-09-29 DIAGNOSIS — R69 Illness, unspecified: Secondary | ICD-10-CM | POA: Diagnosis not present

## 2021-10-02 DIAGNOSIS — R69 Illness, unspecified: Secondary | ICD-10-CM | POA: Diagnosis not present

## 2021-10-06 DIAGNOSIS — R69 Illness, unspecified: Secondary | ICD-10-CM | POA: Diagnosis not present

## 2021-10-13 DIAGNOSIS — R69 Illness, unspecified: Secondary | ICD-10-CM | POA: Diagnosis not present

## 2021-10-15 DIAGNOSIS — Z79899 Other long term (current) drug therapy: Secondary | ICD-10-CM | POA: Diagnosis not present

## 2021-10-24 DIAGNOSIS — R69 Illness, unspecified: Secondary | ICD-10-CM | POA: Diagnosis not present

## 2021-10-30 DIAGNOSIS — R69 Illness, unspecified: Secondary | ICD-10-CM | POA: Diagnosis not present

## 2021-11-07 DIAGNOSIS — R69 Illness, unspecified: Secondary | ICD-10-CM | POA: Diagnosis not present

## 2021-11-10 DIAGNOSIS — R69 Illness, unspecified: Secondary | ICD-10-CM | POA: Diagnosis not present

## 2021-11-27 DIAGNOSIS — R69 Illness, unspecified: Secondary | ICD-10-CM | POA: Diagnosis not present

## 2021-12-04 DIAGNOSIS — R69 Illness, unspecified: Secondary | ICD-10-CM | POA: Diagnosis not present

## 2021-12-11 DIAGNOSIS — R69 Illness, unspecified: Secondary | ICD-10-CM | POA: Diagnosis not present

## 2021-12-18 DIAGNOSIS — R69 Illness, unspecified: Secondary | ICD-10-CM | POA: Diagnosis not present

## 2021-12-23 DIAGNOSIS — R69 Illness, unspecified: Secondary | ICD-10-CM | POA: Diagnosis not present

## 2021-12-25 DIAGNOSIS — R69 Illness, unspecified: Secondary | ICD-10-CM | POA: Diagnosis not present

## 2021-12-29 DIAGNOSIS — R69 Illness, unspecified: Secondary | ICD-10-CM | POA: Diagnosis not present

## 2021-12-30 DIAGNOSIS — R69 Illness, unspecified: Secondary | ICD-10-CM | POA: Diagnosis not present

## 2022-01-01 DIAGNOSIS — R69 Illness, unspecified: Secondary | ICD-10-CM | POA: Diagnosis not present

## 2022-01-08 DIAGNOSIS — R69 Illness, unspecified: Secondary | ICD-10-CM | POA: Diagnosis not present

## 2022-01-15 DIAGNOSIS — R69 Illness, unspecified: Secondary | ICD-10-CM | POA: Diagnosis not present

## 2022-01-22 DIAGNOSIS — R69 Illness, unspecified: Secondary | ICD-10-CM | POA: Diagnosis not present

## 2022-01-29 DIAGNOSIS — R69 Illness, unspecified: Secondary | ICD-10-CM | POA: Diagnosis not present

## 2022-02-09 DIAGNOSIS — R69 Illness, unspecified: Secondary | ICD-10-CM | POA: Diagnosis not present

## 2022-02-12 DIAGNOSIS — R69 Illness, unspecified: Secondary | ICD-10-CM | POA: Diagnosis not present

## 2022-02-17 DIAGNOSIS — F411 Generalized anxiety disorder: Secondary | ICD-10-CM | POA: Diagnosis not present

## 2022-02-17 DIAGNOSIS — R69 Illness, unspecified: Secondary | ICD-10-CM | POA: Diagnosis not present

## 2022-02-26 DIAGNOSIS — F331 Major depressive disorder, recurrent, moderate: Secondary | ICD-10-CM | POA: Diagnosis not present

## 2022-02-26 DIAGNOSIS — R69 Illness, unspecified: Secondary | ICD-10-CM | POA: Diagnosis not present

## 2022-02-27 DIAGNOSIS — R69 Illness, unspecified: Secondary | ICD-10-CM | POA: Diagnosis not present

## 2022-02-27 DIAGNOSIS — F331 Major depressive disorder, recurrent, moderate: Secondary | ICD-10-CM | POA: Diagnosis not present

## 2022-03-02 DIAGNOSIS — F331 Major depressive disorder, recurrent, moderate: Secondary | ICD-10-CM | POA: Diagnosis not present

## 2022-03-02 DIAGNOSIS — R69 Illness, unspecified: Secondary | ICD-10-CM | POA: Diagnosis not present

## 2022-03-03 DIAGNOSIS — R69 Illness, unspecified: Secondary | ICD-10-CM | POA: Diagnosis not present

## 2022-03-03 DIAGNOSIS — F331 Major depressive disorder, recurrent, moderate: Secondary | ICD-10-CM | POA: Diagnosis not present

## 2022-03-04 DIAGNOSIS — R69 Illness, unspecified: Secondary | ICD-10-CM | POA: Diagnosis not present

## 2022-03-04 DIAGNOSIS — F331 Major depressive disorder, recurrent, moderate: Secondary | ICD-10-CM | POA: Diagnosis not present

## 2022-03-05 DIAGNOSIS — R69 Illness, unspecified: Secondary | ICD-10-CM | POA: Diagnosis not present

## 2022-03-05 DIAGNOSIS — F331 Major depressive disorder, recurrent, moderate: Secondary | ICD-10-CM | POA: Diagnosis not present

## 2022-03-06 DIAGNOSIS — R69 Illness, unspecified: Secondary | ICD-10-CM | POA: Diagnosis not present

## 2022-03-06 DIAGNOSIS — F331 Major depressive disorder, recurrent, moderate: Secondary | ICD-10-CM | POA: Diagnosis not present

## 2022-03-09 DIAGNOSIS — F331 Major depressive disorder, recurrent, moderate: Secondary | ICD-10-CM | POA: Diagnosis not present

## 2022-03-09 DIAGNOSIS — R69 Illness, unspecified: Secondary | ICD-10-CM | POA: Diagnosis not present

## 2022-03-10 DIAGNOSIS — F331 Major depressive disorder, recurrent, moderate: Secondary | ICD-10-CM | POA: Diagnosis not present

## 2022-03-10 DIAGNOSIS — R69 Illness, unspecified: Secondary | ICD-10-CM | POA: Diagnosis not present

## 2022-03-11 DIAGNOSIS — F331 Major depressive disorder, recurrent, moderate: Secondary | ICD-10-CM | POA: Diagnosis not present

## 2022-03-11 DIAGNOSIS — R69 Illness, unspecified: Secondary | ICD-10-CM | POA: Diagnosis not present

## 2022-03-12 DIAGNOSIS — R69 Illness, unspecified: Secondary | ICD-10-CM | POA: Diagnosis not present

## 2022-03-12 DIAGNOSIS — F331 Major depressive disorder, recurrent, moderate: Secondary | ICD-10-CM | POA: Diagnosis not present

## 2022-03-13 DIAGNOSIS — R69 Illness, unspecified: Secondary | ICD-10-CM | POA: Diagnosis not present

## 2022-03-13 DIAGNOSIS — F331 Major depressive disorder, recurrent, moderate: Secondary | ICD-10-CM | POA: Diagnosis not present

## 2022-03-16 DIAGNOSIS — R69 Illness, unspecified: Secondary | ICD-10-CM | POA: Diagnosis not present

## 2022-03-16 DIAGNOSIS — F331 Major depressive disorder, recurrent, moderate: Secondary | ICD-10-CM | POA: Diagnosis not present

## 2022-03-17 ENCOUNTER — Other Ambulatory Visit (HOSPITAL_COMMUNITY)
Admission: RE | Admit: 2022-03-17 | Discharge: 2022-03-17 | Disposition: A | Discharge status: Home / Self Care | Payer: 59 | Source: Ambulatory Visit | Attending: Obstetrics & Gynecology | Admitting: Obstetrics & Gynecology

## 2022-03-17 ENCOUNTER — Encounter (HOSPITAL_BASED_OUTPATIENT_CLINIC_OR_DEPARTMENT_OTHER): Payer: Self-pay | Admitting: Obstetrics & Gynecology

## 2022-03-17 ENCOUNTER — Ambulatory Visit (INDEPENDENT_AMBULATORY_CARE_PROVIDER_SITE_OTHER): Payer: 59 | Admitting: Obstetrics & Gynecology

## 2022-03-17 VITALS — BP 140/86 | HR 75 | Ht 65.0 in | Wt 157.8 lb

## 2022-03-17 DIAGNOSIS — Z30017 Encounter for initial prescription of implantable subdermal contraceptive: Secondary | ICD-10-CM

## 2022-03-17 DIAGNOSIS — Z01419 Encounter for gynecological examination (general) (routine) without abnormal findings: Secondary | ICD-10-CM

## 2022-03-17 DIAGNOSIS — Z124 Encounter for screening for malignant neoplasm of cervix: Secondary | ICD-10-CM | POA: Diagnosis not present

## 2022-03-17 DIAGNOSIS — Z3046 Encounter for surveillance of implantable subdermal contraceptive: Secondary | ICD-10-CM | POA: Diagnosis not present

## 2022-03-17 MED ORDER — ETONOGESTREL 68 MG ~~LOC~~ IMPL
68.0000 mg | DRUG_IMPLANT | Freq: Once | SUBCUTANEOUS | Status: AC
  Administered 2022-03-17: 68 mg via SUBCUTANEOUS

## 2022-03-17 NOTE — Progress Notes (Signed)
26 y.o. G0P0000 Single White or Caucasian female here for annual exam.  Last appt with pt was in 07/2019.  She has a Nexplanon that was placed 03/23/2019.  Desires to have this removed and replaced today.  She has little bleeding since Nexplanon insertion.  Considering going back to school right now.  No LMP recorded. Patient has had an implant.          Sexually active: Yes.    The current method of family planning is Nexplanon.    Smoker:  no  Health Maintenance: Pap:  due today History of abnormal Pap:  no MMG:  guidelines reviewed Colonoscopy:  guidelines reviewed   reports that she has never smoked. She has never used smokeless tobacco. She reports current alcohol use of about 1.0 - 2.0 standard drink of alcohol per week. She reports that she does not currently use drugs.  Past Medical History:  Diagnosis Date   Anxiety    Depression     Past Surgical History:  Procedure Laterality Date   ADENOIDECTOMY  2000   TONSILLECTOMY  2008    Current Outpatient Medications  Medication Sig Dispense Refill   DULoxetine (CYMBALTA) 30 MG capsule      etonogestrel (NEXPLANON) 68 MG IMPL implant 1 each by Subdermal route once.     L-Methylfolate 15 MG TABS      No current facility-administered medications for this visit.    Family History  Problem Relation Age of Onset   Alcohol abuse Paternal Aunt    Depression Maternal Uncle    Depression Maternal Grandmother    Anxiety disorder Maternal Grandmother    Depression Paternal Grandmother    Depression Mother    Anxiety disorder Sister    Breast cancer Other        maternal GGM   Suicidality Neg Hx    ROS: Genitourinary:negative  Exam:   BP 140/86 (BP Location: Right Arm, Patient Position: Sitting, Cuff Size: Normal)   Pulse 75   Ht 5\' 5"  (1.651 m) Comment: Reported  Wt 157 lb 12.8 oz (71.6 kg)   BMI 26.26 kg/m   Height: 5\' 5"  (165.1 cm) (Reported)  General appearance: alert, cooperative and appears stated age Head:  Normocephalic, without obvious abnormality, atraumatic Neck: no adenopathy, supple, symmetrical, trachea midline and thyroid normal to inspection and palpation Lungs: clear to auscultation bilaterally Breasts: normal appearance, no masses or tenderness Heart: regular rate and rhythm Abdomen: soft, non-tender; bowel sounds normal; no masses,  no organomegaly Extremities: extremities normal, atraumatic, no cyanosis or edema Skin: Skin color, texture, turgor normal. No rashes or lesions Lymph nodes: Cervical, supraclavicular, and axillary nodes normal. No abnormal inguinal nodes palpated Neurologic: Grossly normal   Pelvic: External genitalia:  no lesions              Urethra:  normal appearing urethra with no masses, tenderness or lesions              Bartholins and Skenes: normal                 Vagina: normal appearing vagina with normal color and no discharge, no lesions              Cervix: no lesions              Pap taken: Yes.   Bimanual Exam:  Uterus:  normal size, contour, position, consistency, mobility, non-tender              Adnexa: normal  adnexa and no mass, fullness, tenderness  After physical exam, Nexplanon removal and replacement procedure performed.  Consents obtained.  Procedure: Patient placed supine on exam table with her left arm flexed at the elbow and externally rotated. The prior insertion site was located and the Nexplanon rod was palpated.  Area cleansed with Betadine x 3 and draped in normal sterile fashion.  Insertion site and surrounding tissue anesthetized with 1% Lidocaine without epinephrine, 1.5cc total used.  Small incision made with #11 blade.  Capsule around rod was dissected with #11 blade, releasing the Nexplanon rod.  This was then removed without difficulty using hemostat.  Then using 1% Lidocaine, the insertion track was anesthetized.  Another 2.5cc was used.  The nexplanon device was obtained and presence of rod confirmed.  Then using same incision  and tenting up skin to keep rod placement just beneath the skin, the device was passed underneath the skin.  Device deployed and rod inserted.  Device removed.  Rod easily palpated in arm.  Small amount of bleeding was noted and made hemostatic with pressure.  Steri-strips were applied and pressure dressing placed over the site.  Entire procedure performed with sterile technique.  Pt tolerated procedure well.   Chaperone, Ina Homes, CMA, was present for exam.  Assessment/Plan: 1. Well woman exam with routine gynecological exam - pap smear obtained today - breast and colon cancer screening guidelines reviewed - vaccines reviewed/updated  2. Cervical cancer screening - Cytology - PAP( [place])  3. Encounter for Nexplanon removal and Insertion of Nexplanon - pt tolerated procedure well.  Post procedure instructions given.   - removal and/or replacement due by three years--03/17/2025 - etonogestrel (NEXPLANON) implant 68 mg

## 2022-03-18 DIAGNOSIS — R69 Illness, unspecified: Secondary | ICD-10-CM | POA: Diagnosis not present

## 2022-03-18 DIAGNOSIS — F331 Major depressive disorder, recurrent, moderate: Secondary | ICD-10-CM | POA: Diagnosis not present

## 2022-03-19 DIAGNOSIS — R69 Illness, unspecified: Secondary | ICD-10-CM | POA: Diagnosis not present

## 2022-03-19 DIAGNOSIS — F331 Major depressive disorder, recurrent, moderate: Secondary | ICD-10-CM | POA: Diagnosis not present

## 2022-03-19 LAB — CYTOLOGY - PAP
Diagnosis: NEGATIVE
Diagnosis: REACTIVE

## 2022-03-20 ENCOUNTER — Encounter (HOSPITAL_BASED_OUTPATIENT_CLINIC_OR_DEPARTMENT_OTHER): Payer: Self-pay | Admitting: Obstetrics & Gynecology

## 2022-03-20 DIAGNOSIS — R69 Illness, unspecified: Secondary | ICD-10-CM | POA: Diagnosis not present

## 2022-03-20 DIAGNOSIS — Z30017 Encounter for initial prescription of implantable subdermal contraceptive: Secondary | ICD-10-CM | POA: Insufficient documentation

## 2022-03-20 DIAGNOSIS — F331 Major depressive disorder, recurrent, moderate: Secondary | ICD-10-CM | POA: Diagnosis not present

## 2022-03-23 DIAGNOSIS — F331 Major depressive disorder, recurrent, moderate: Secondary | ICD-10-CM | POA: Diagnosis not present

## 2022-03-23 DIAGNOSIS — R69 Illness, unspecified: Secondary | ICD-10-CM | POA: Diagnosis not present

## 2022-03-25 DIAGNOSIS — R69 Illness, unspecified: Secondary | ICD-10-CM | POA: Diagnosis not present

## 2022-03-25 DIAGNOSIS — F331 Major depressive disorder, recurrent, moderate: Secondary | ICD-10-CM | POA: Diagnosis not present

## 2022-03-26 DIAGNOSIS — R69 Illness, unspecified: Secondary | ICD-10-CM | POA: Diagnosis not present

## 2022-03-26 DIAGNOSIS — F331 Major depressive disorder, recurrent, moderate: Secondary | ICD-10-CM | POA: Diagnosis not present

## 2022-03-27 DIAGNOSIS — R69 Illness, unspecified: Secondary | ICD-10-CM | POA: Diagnosis not present

## 2022-03-27 DIAGNOSIS — F331 Major depressive disorder, recurrent, moderate: Secondary | ICD-10-CM | POA: Diagnosis not present

## 2022-03-30 DIAGNOSIS — R69 Illness, unspecified: Secondary | ICD-10-CM | POA: Diagnosis not present

## 2022-03-30 DIAGNOSIS — F331 Major depressive disorder, recurrent, moderate: Secondary | ICD-10-CM | POA: Diagnosis not present

## 2022-03-31 DIAGNOSIS — F331 Major depressive disorder, recurrent, moderate: Secondary | ICD-10-CM | POA: Diagnosis not present

## 2022-03-31 DIAGNOSIS — R69 Illness, unspecified: Secondary | ICD-10-CM | POA: Diagnosis not present

## 2022-04-01 DIAGNOSIS — R69 Illness, unspecified: Secondary | ICD-10-CM | POA: Diagnosis not present

## 2022-04-01 DIAGNOSIS — F331 Major depressive disorder, recurrent, moderate: Secondary | ICD-10-CM | POA: Diagnosis not present

## 2022-04-02 DIAGNOSIS — R69 Illness, unspecified: Secondary | ICD-10-CM | POA: Diagnosis not present

## 2022-04-02 DIAGNOSIS — F331 Major depressive disorder, recurrent, moderate: Secondary | ICD-10-CM | POA: Diagnosis not present

## 2022-04-03 DIAGNOSIS — R69 Illness, unspecified: Secondary | ICD-10-CM | POA: Diagnosis not present

## 2022-04-03 DIAGNOSIS — F331 Major depressive disorder, recurrent, moderate: Secondary | ICD-10-CM | POA: Diagnosis not present

## 2022-04-06 DIAGNOSIS — F331 Major depressive disorder, recurrent, moderate: Secondary | ICD-10-CM | POA: Diagnosis not present

## 2022-04-06 DIAGNOSIS — R69 Illness, unspecified: Secondary | ICD-10-CM | POA: Diagnosis not present

## 2022-04-07 DIAGNOSIS — F331 Major depressive disorder, recurrent, moderate: Secondary | ICD-10-CM | POA: Diagnosis not present

## 2022-04-07 DIAGNOSIS — R69 Illness, unspecified: Secondary | ICD-10-CM | POA: Diagnosis not present

## 2022-04-08 DIAGNOSIS — R69 Illness, unspecified: Secondary | ICD-10-CM | POA: Diagnosis not present

## 2022-04-08 DIAGNOSIS — F331 Major depressive disorder, recurrent, moderate: Secondary | ICD-10-CM | POA: Diagnosis not present

## 2022-04-09 DIAGNOSIS — F331 Major depressive disorder, recurrent, moderate: Secondary | ICD-10-CM | POA: Diagnosis not present

## 2022-04-09 DIAGNOSIS — R69 Illness, unspecified: Secondary | ICD-10-CM | POA: Diagnosis not present

## 2022-04-10 DIAGNOSIS — R69 Illness, unspecified: Secondary | ICD-10-CM | POA: Diagnosis not present

## 2022-04-10 DIAGNOSIS — F331 Major depressive disorder, recurrent, moderate: Secondary | ICD-10-CM | POA: Diagnosis not present

## 2022-04-23 DIAGNOSIS — R69 Illness, unspecified: Secondary | ICD-10-CM | POA: Diagnosis not present

## 2022-04-30 DIAGNOSIS — R69 Illness, unspecified: Secondary | ICD-10-CM | POA: Diagnosis not present

## 2022-05-14 DIAGNOSIS — R69 Illness, unspecified: Secondary | ICD-10-CM | POA: Diagnosis not present

## 2022-06-01 DIAGNOSIS — R69 Illness, unspecified: Secondary | ICD-10-CM | POA: Diagnosis not present

## 2022-06-04 DIAGNOSIS — F411 Generalized anxiety disorder: Secondary | ICD-10-CM | POA: Diagnosis not present

## 2022-06-04 DIAGNOSIS — R69 Illness, unspecified: Secondary | ICD-10-CM | POA: Diagnosis not present

## 2022-06-18 DIAGNOSIS — R69 Illness, unspecified: Secondary | ICD-10-CM | POA: Diagnosis not present

## 2022-07-02 DIAGNOSIS — J0140 Acute pansinusitis, unspecified: Secondary | ICD-10-CM | POA: Diagnosis not present

## 2022-07-02 DIAGNOSIS — R051 Acute cough: Secondary | ICD-10-CM | POA: Diagnosis not present

## 2022-11-26 ENCOUNTER — Telehealth: Payer: Self-pay

## 2022-11-26 NOTE — Telephone Encounter (Signed)
Mychart msg sent

## 2023-01-25 ENCOUNTER — Encounter (HOSPITAL_COMMUNITY): Payer: Self-pay

## 2023-03-17 ENCOUNTER — Other Ambulatory Visit: Payer: Self-pay

## 2023-03-17 ENCOUNTER — Ambulatory Visit (HOSPITAL_COMMUNITY)
Admission: EM | Admit: 2023-03-17 | Discharge: 2023-03-17 | Disposition: A | Discharge status: Home / Self Care | Payer: Medicaid Other | Attending: Family Medicine | Admitting: Family Medicine

## 2023-03-17 ENCOUNTER — Encounter (HOSPITAL_COMMUNITY): Payer: Self-pay | Admitting: Emergency Medicine

## 2023-03-17 DIAGNOSIS — S61411A Laceration without foreign body of right hand, initial encounter: Secondary | ICD-10-CM

## 2023-03-17 DIAGNOSIS — Z23 Encounter for immunization: Secondary | ICD-10-CM

## 2023-03-17 MED ORDER — TETANUS-DIPHTH-ACELL PERTUSSIS 5-2.5-18.5 LF-MCG/0.5 IM SUSY
PREFILLED_SYRINGE | INTRAMUSCULAR | Status: AC
  Filled 2023-03-17: qty 0.5

## 2023-03-17 MED ORDER — LIDOCAINE-EPINEPHRINE-TETRACAINE (LET) TOPICAL GEL
TOPICAL | Status: AC
  Filled 2023-03-17: qty 3

## 2023-03-17 MED ORDER — LIDOCAINE-EPINEPHRINE-TETRACAINE (LET) TOPICAL GEL: 3.0000 mL | Freq: Once | TOPICAL | Status: DC

## 2023-03-17 MED ORDER — TETANUS-DIPHTH-ACELL PERTUSSIS 5-2.5-18.5 LF-MCG/0.5 IM SUSY
0.5000 mL | PREFILLED_SYRINGE | Freq: Once | INTRAMUSCULAR | Status: AC
  Administered 2023-03-17: 0.5 mL via INTRAMUSCULAR

## 2023-03-17 NOTE — ED Triage Notes (Signed)
Patient cut right hand on a lid that was partially opened by a can opener.  Patient was trying to manipulate lid the remainder of the way and cut hand below right ring finger.  Site continues to bleed.  Incident occurred approximately 30 minutes ago.  Patient is able to move fingers , make a fist.  Reports feeling a tingling sensation and feeling pulse in hand, denies numbness.  Patient does not know when last tetanus was

## 2023-03-17 NOTE — Discharge Instructions (Signed)
Change dressing at least twice daily. After pressure dressing removed may cover with regular bandage. You received your TDAP vaccine today which is good for 10 years.  Return in 7 days for suture removal.

## 2023-03-17 NOTE — ED Provider Notes (Signed)
[facility]    CSN: [id number] Arrival date & time: 03/17/23  1901      History   Chief Complaint Chief Complaint  Patient presents with   Laceration    HPI Jamie Cross is a 27 y.o. female.   HPI Patient presents today for a laceration repair.  Patient was appearing dinner and reports that she accidentally cut the palmar surface of her right hand on the edge of a sharp can lid.  Uncertain when her last tetanus vaccine was.  Bleeding is well-controlled.  Patient denies any loss of sensation in her fingers.  Injury occurred approximately 1 hour prior to arriving urgent care.  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 Authorizing Provider  DULoxetine (CYMBALTA) 30 MG capsule  03/01/22   [provider]  etonogestrel (NEXPLANON) 68 MG IMPL implant 1 each by Subdermal route once.    [provider]  L-Methylfolate 15 MG TABS     [provider]    Family History Family History  Problem Relation Age of Onset   Alcohol abuse Paternal Aunt    Depression Maternal Uncle    Depression Maternal Grandmother    Anxiety disorder Maternal Grandmother    Depression Paternal Grandmother     Depression Mother    Anxiety disorder Sister    Breast cancer Other        maternal GGM   Suicidality Neg Hx     Social History Social History   Tobacco Use   Smoking status: Never   Smokeless tobacco: Never  Vaping Use   Vaping Use: Never used  Substance Use Topics   Alcohol use: Yes    Alcohol/week: 1.0 - 2.0 standard drink of alcohol    Types: 1 - 2 Standard drinks or equivalent per week   Drug use: Not Currently    Types: Marijuana     Allergies   Patient has no known allergies.   Review of Systems Review of Systems Pertinent negatives listed in HPI   Physical Exam Triage Vital Signs ED Triage Vitals  Enc Vitals Group     BP 03/17/23 1920 118/85     Pulse Rate 03/17/23 1920 86     Resp 03/17/23 1920 18     Temp 03/17/23 1920 98.4 F (36.9 C)     Temp Source 03/17/23 1920 Oral     SpO2 03/17/23 1920 96 %     Weight --  Height --      Head Circumference --      Peak Flow --      Pain Score 03/17/23 1917 2     Pain Loc --      Pain Edu? --      Excl. in GC? --    No data found.  Updated Vital Signs BP 118/85 (BP Location: Left Arm)   Pulse 86   Temp 98.4 F (36.9 C) (Oral)   Resp 18   SpO2 96%   Visual Acuity Right Eye Distance:   Left Eye Distance:   Bilateral Distance:    Right Eye Near:   Left Eye Near:    Bilateral Near:     Physical Exam Vitals reviewed.  Constitutional:      Appearance: Normal appearance.  HENT:     Head: Normocephalic.  Cardiovascular:     Rate and Rhythm: Normal rate and regular rhythm.  Pulmonary:     Effort: Pulmonary effort is normal.  Musculoskeletal:       Arms:     Comments: Laceration 3.5 cm linear, well approximated, skin laceration   Skin:    Capillary Refill: Capillary refill takes less than 2 seconds.  Neurological:     General: No focal deficit present.     Mental Status: She is alert and oriented to person, place, and time.      UC Treatments / Results  Labs (all labs ordered  are listed, but only abnormal results are displayed) Labs Reviewed - No data to display  EKG   Radiology No results found.  Procedures Laceration Repair  Date/Time: 03/17/2023 8:06 PM  Performed by: Bing Neighbors, NP Authorized by: Bing Neighbors, NP   Consent:    Consent obtained:  Verbal   Consent given by:  Patient   Risks discussed:  Infection and pain   Alternatives discussed:  No treatment Universal protocol:    Patient identity confirmed:  Verbally with patient Laceration details:    Location:  Hand   Hand location:  R palm   Length (cm):  3.5   Depth (mm):  0.5 Treatment:    Area cleansed with:  Povidone-iodine   Amount of cleaning:  Standard   Irrigation solution:  Tap water Skin repair:    Repair method:  Sutures   Suture size:  4-0   Suture material:  Prolene   Number of sutures:  2 Approximation:    Approximation:  Close Repair type:    Repair type:  Simple Post-procedure details:    Dressing:  Bulky dressing   Procedure completion:  Tolerated with difficulty  (including critical care time)  Medications Ordered in UC Medications  Tdap (BOOSTRIX) injection 0.5 mL (0.5 mLs Intramuscular Given 03/17/23 2009)    Initial Impression / Assessment and Plan / UC Course  I have reviewed the triage vital signs and the nursing notes.  Pertinent labs & imaging results that were available during my care of the patient were reviewed by me and considered in my medical decision making (see chart for details).    Laceration of the right palmar surface of the hand repaired with 2 simple interrupted sutures.  Tolerated procedure.  Tetanus vaccine updated.  Return in 7 days to have sutures removed.  Wound care discussed.  Return sooner if needed. Final Clinical Impressions(s) / UC Diagnoses   Final diagnoses:  Laceration of right hand without foreign body, initial encounter  Need for diphtheria-tetanus-pertussis (Tdap) vaccine  Discharge Instructions       Change dressing at least twice daily. After pressure dressing removed may cover with regular bandage. You received your TDAP vaccine today which is good for 10 years.  Return in 7 days for suture removal.     ED Prescriptions   None    PDMP not reviewed this encounter.   Bing Neighbors, NP 03/18/23 [phone]

## 2023-10-29 DIAGNOSIS — F332 Major depressive disorder, recurrent severe without psychotic features: Secondary | ICD-10-CM | POA: Diagnosis not present

## 2023-11-01 DIAGNOSIS — F332 Major depressive disorder, recurrent severe without psychotic features: Secondary | ICD-10-CM | POA: Diagnosis not present

## 2023-11-02 DIAGNOSIS — Z23 Encounter for immunization: Secondary | ICD-10-CM | POA: Diagnosis not present

## 2023-11-02 DIAGNOSIS — F331 Major depressive disorder, recurrent, moderate: Secondary | ICD-10-CM | POA: Diagnosis not present

## 2023-11-02 DIAGNOSIS — R140 Abdominal distension (gaseous): Secondary | ICD-10-CM | POA: Diagnosis not present

## 2023-11-02 DIAGNOSIS — F5104 Psychophysiologic insomnia: Secondary | ICD-10-CM | POA: Diagnosis not present

## 2023-11-02 DIAGNOSIS — F4310 Post-traumatic stress disorder, unspecified: Secondary | ICD-10-CM | POA: Diagnosis not present

## 2023-11-02 DIAGNOSIS — F411 Generalized anxiety disorder: Secondary | ICD-10-CM | POA: Diagnosis not present

## 2023-11-08 DIAGNOSIS — F332 Major depressive disorder, recurrent severe without psychotic features: Secondary | ICD-10-CM | POA: Diagnosis not present

## 2023-11-15 DIAGNOSIS — F332 Major depressive disorder, recurrent severe without psychotic features: Secondary | ICD-10-CM | POA: Diagnosis not present

## 2023-11-22 DIAGNOSIS — F332 Major depressive disorder, recurrent severe without psychotic features: Secondary | ICD-10-CM | POA: Diagnosis not present

## 2023-11-29 DIAGNOSIS — F332 Major depressive disorder, recurrent severe without psychotic features: Secondary | ICD-10-CM | POA: Diagnosis not present

## 2023-12-02 ENCOUNTER — Encounter (HOSPITAL_BASED_OUTPATIENT_CLINIC_OR_DEPARTMENT_OTHER): Payer: Self-pay | Admitting: Obstetrics & Gynecology

## 2023-12-02 ENCOUNTER — Ambulatory Visit (HOSPITAL_BASED_OUTPATIENT_CLINIC_OR_DEPARTMENT_OTHER): Admitting: Obstetrics & Gynecology

## 2023-12-02 ENCOUNTER — Other Ambulatory Visit (HOSPITAL_BASED_OUTPATIENT_CLINIC_OR_DEPARTMENT_OTHER): Payer: Self-pay

## 2023-12-02 VITALS — BP 118/72 | HR 80 | Ht 72.0 in | Wt 182.6 lb

## 2023-12-02 DIAGNOSIS — R102 Pelvic and perineal pain: Secondary | ICD-10-CM | POA: Diagnosis not present

## 2023-12-02 DIAGNOSIS — Z975 Presence of (intrauterine) contraceptive device: Secondary | ICD-10-CM | POA: Diagnosis not present

## 2023-12-02 DIAGNOSIS — N939 Abnormal uterine and vaginal bleeding, unspecified: Secondary | ICD-10-CM

## 2023-12-02 MED ORDER — IBUPROFEN 800 MG PO TABS
800.0000 mg | ORAL_TABLET | Freq: Three times a day (TID) | ORAL | 0 refills | Status: AC | PRN
  Filled 2023-12-02: qty 30, 10d supply, fill #0

## 2023-12-02 NOTE — Progress Notes (Signed)
 GYNECOLOGY  VISIT  CC:   heavy bleeding, cramping  HPI: 28 y.o. G0P0000 Single White or Caucasian female here for complaint of increased pelvic cramping and heavier bleeding that has occurred with this cycle.  Typically, she does have heavier bleeding and clots but not typically so much cramping.  Yesterday, she had some hip pain.  Today bleeding became very heavy with large clots.   She has irregular bleeding with her Nexplanon.  The prior cycle was before the holidays and was heavy for 1 to 2 days.  Typically bleeding lasts 5 days total.  She does typically have some clotting.   Last Nexplanon was placed 02/2022.    She is SA.  Last episode was two weeks ago.    Past Medical History:  Diagnosis Date   Anxiety    Depression     MEDS:   Current Outpatient Medications on File Prior to Visit  Medication Sig Dispense Refill   Cholecalciferol (VITAMIN D) 50 MCG (2000 UT) tablet Take 2,000 Units by mouth daily.     etonogestrel (NEXPLANON) 68 MG IMPL implant 1 each by Subdermal route once.     L-Methylfolate 15 MG TABS      No current facility-administered medications on file prior to visit.    ALLERGIES: Patient has no known allergies.  SH:  single, non smoker  Review of Systems  Constitutional: Negative.   Genitourinary:        Cramping, passing clots    PHYSICAL EXAMINATION:    BP (!) 142/98 (BP Location: Right Arm, Patient Position: Sitting, Cuff Size: Large)   Pulse 80   Ht 6' (1.829 m)   Wt 182 lb 9.6 oz (82.8 kg)   LMP 12/02/2023   BMI 24.77 kg/m     General appearance: alert, cooperative and appears stated age  Abdomen: soft, non-tender; bowel sounds normal; no masses,  no organomegaly Lymph:  no inguinal LAD noted  Pelvic: External genitalia:  no lesions              Urethra:  normal appearing urethra with no masses, tenderness or lesions              Bartholins and Skenes: normal                 Vagina:  normal without lesions              Cervix: no lesions  and some blood with mucous present but no clots              Bimanual Exam:  Uterus:  normal size, contour, position, consistency, mobility, non-tender              Adnexa: no mass, fullness, tenderness          Chaperone, Hendricks Milo, CMA, was present for exam.  Assessment/Plan: 1. Abnormal uterine bleeding (Primary) - Discussed with pt exam is normal but I am concerned given the amount of clotting - will r/o pregnancy, Beta hCG quant (ref lab) - CBC - US PELVIC COMPLETE WITH TRANSVAGINAL; Future  2. Pelvic cramping - ibuprofen (ADVIL) 800 MG tablet; Take 1 tablet (800 mg total) by mouth every 8 (eight) hours as needed.  Dispense: 30 tablet; Refill: 0  3. Nexplanon in place

## 2023-12-03 ENCOUNTER — Ambulatory Visit (HOSPITAL_BASED_OUTPATIENT_CLINIC_OR_DEPARTMENT_OTHER)
Admission: RE | Admit: 2023-12-03 | Discharge: 2023-12-03 | Disposition: A | Discharge status: Home / Self Care | Source: Ambulatory Visit | Attending: Obstetrics & Gynecology | Admitting: Obstetrics & Gynecology

## 2023-12-03 DIAGNOSIS — N939 Abnormal uterine and vaginal bleeding, unspecified: Secondary | ICD-10-CM | POA: Insufficient documentation

## 2023-12-03 LAB — CBC
Hematocrit: 43.3 % (ref 34.0–46.6)
Hemoglobin: 14.1 g/dL (ref 11.1–15.9)
MCH: 28.8 pg (ref 26.6–33.0)
MCHC: 32.6 g/dL (ref 31.5–35.7)
MCV: 89 fL (ref 79–97)
Platelets: 337 10*3/uL (ref 150–450)
RBC: 4.89 x10E6/uL (ref 3.77–5.28)
RDW: 14 % (ref 11.7–15.4)
WBC: 10.1 10*3/uL (ref 3.4–10.8)

## 2023-12-03 LAB — BETA HCG QUANT (REF LAB): hCG Quant: <1 m[IU]/mL

## 2023-12-06 DIAGNOSIS — F332 Major depressive disorder, recurrent severe without psychotic features: Secondary | ICD-10-CM | POA: Diagnosis not present

## 2023-12-06 DIAGNOSIS — F4312 Post-traumatic stress disorder, chronic: Secondary | ICD-10-CM | POA: Diagnosis not present

## 2023-12-07 ENCOUNTER — Telehealth (HOSPITAL_BASED_OUTPATIENT_CLINIC_OR_DEPARTMENT_OTHER): Payer: Self-pay

## 2023-12-07 NOTE — Telephone Encounter (Signed)
 Patient left voicemail wanting to know if there was any results from her current ultrasound. After speaking with the nurse, I called the patient back to inform her that as of right now the ultrasound has been read. 12/07/23 @ 12:53pM

## 2023-12-10 ENCOUNTER — Encounter (HOSPITAL_BASED_OUTPATIENT_CLINIC_OR_DEPARTMENT_OTHER): Payer: Self-pay | Admitting: Obstetrics & Gynecology

## 2023-12-20 DIAGNOSIS — F4312 Post-traumatic stress disorder, chronic: Secondary | ICD-10-CM | POA: Diagnosis not present

## 2023-12-20 DIAGNOSIS — F332 Major depressive disorder, recurrent severe without psychotic features: Secondary | ICD-10-CM | POA: Diagnosis not present

## 2023-12-22 ENCOUNTER — Encounter (HOSPITAL_BASED_OUTPATIENT_CLINIC_OR_DEPARTMENT_OTHER): Payer: Self-pay | Admitting: Obstetrics & Gynecology

## 2023-12-22 ENCOUNTER — Other Ambulatory Visit: Payer: Self-pay

## 2023-12-22 ENCOUNTER — Other Ambulatory Visit (HOSPITAL_BASED_OUTPATIENT_CLINIC_OR_DEPARTMENT_OTHER): Payer: Self-pay

## 2023-12-22 ENCOUNTER — Ambulatory Visit (HOSPITAL_BASED_OUTPATIENT_CLINIC_OR_DEPARTMENT_OTHER): Admitting: Obstetrics & Gynecology

## 2023-12-22 VITALS — BP 128/89 | HR 85 | Ht 72.0 in | Wt 180.6 lb

## 2023-12-22 DIAGNOSIS — Z975 Presence of (intrauterine) contraceptive device: Secondary | ICD-10-CM

## 2023-12-22 DIAGNOSIS — N939 Abnormal uterine and vaginal bleeding, unspecified: Secondary | ICD-10-CM

## 2023-12-22 MED ORDER — NORETHINDRONE 0.35 MG PO TABS
1.0000 | ORAL_TABLET | Freq: Every day | ORAL | 1 refills | Status: AC
  Filled 2023-12-22: qty 84, 84d supply, fill #0
  Filled 2024-03-10: qty 84, 84d supply, fill #1

## 2023-12-22 NOTE — Progress Notes (Signed)
 GYNECOLOGY  VISIT  CC:   follow up after ultrasound, lab work  HPI: 28 y.o. G0P0000 Single White or Caucasian female here for discussion of bleeding, cramping issues that she experienced in mid March.  Cycles are irregular.  Had nexplanon in place and has really liked this method.  Absolutely does not want to be pregnant so wants very reliable contraception.  Has a lot of symptoms with menstrual cycles so also likes being able to skip cycles which does occur for her with nexplanon.  Reviewed lab work and ultrasound done during the past few weeks.  No significant abnormality noted.  Discussed options for management including adding OCP or POP.  She does not want to use estrogen containing option.  POP options, dosing and side effects all reviewed.  Will send rx.  We did spend some time discussing what would be other options if this doesn't work.  Specifically she has considered hysterectomy.  In long term relationship with partner who also does not want to father children.  They have discussed this.  She's not ready at this point for this type of procedure but just wants to know options.  We did discuss procedure, risks and benefits, recovery and time out of work.     Past Medical History:  Diagnosis Date   Anxiety    Depression     MEDS:   Current Outpatient Medications on File Prior to Visit  Medication Sig Dispense Refill   Cholecalciferol (VITAMIN D) 50 MCG (2000 UT) tablet Take 2,000 Units by mouth daily.     etonogestrel (NEXPLANON) 68 MG IMPL implant 1 each by Subdermal route once.     L-Methylfolate 15 MG TABS      ibuprofen (ADVIL) 800 MG tablet Take 1 tablet (800 mg total) by mouth every 8 (eight) hours as needed. (Patient not taking: Reported on 12/22/2023) 30 tablet 0   No current facility-administered medications on file prior to visit.    ALLERGIES: Patient has no known allergies.  SH:  single but in longer term relationship, non smoker  Review of Systems  Constitutional:  Negative.   Genitourinary:        Irregular bleeding    PHYSICAL EXAMINATION:    BP 128/89 (BP Location: Left Arm, Patient Position: Sitting)   Pulse 85   Ht 6' (1.829 m)   Wt 180 lb 9.6 oz (81.9 kg)   LMP 12/02/2023   BMI 24.49 kg/m     Physical Exam Constitutional:      Appearance: Normal appearance.  Neurological:     General: No focal deficit present.     Mental Status: She is alert.  Psychiatric:        Mood and Affect: Mood normal.        Behavior: Behavior normal.        Thought Content: Thought content normal.        Judgment: Judgment normal.     Assessment/Plan: 1. Abnormal uterine bleeding (Primary) - for now will add POP and plan follow up pending response.  She understands continuous dosing.  She knows to call with any concerns/side effects - norethindrone (MICRONOR) 0.35 MG tablet; Take 1 tablet (0.35 mg total) by mouth daily.  Dispense: 84 tablet; Refill: 1  2. Nexplanon in place  Total time with pt:  32 minutes Documentation;  5 minutes Total time:  37 minutes

## 2023-12-25 ENCOUNTER — Encounter (HOSPITAL_BASED_OUTPATIENT_CLINIC_OR_DEPARTMENT_OTHER): Payer: Self-pay | Admitting: Obstetrics & Gynecology

## 2023-12-27 DIAGNOSIS — F4312 Post-traumatic stress disorder, chronic: Secondary | ICD-10-CM | POA: Diagnosis not present

## 2023-12-27 DIAGNOSIS — F332 Major depressive disorder, recurrent severe without psychotic features: Secondary | ICD-10-CM | POA: Diagnosis not present

## 2023-12-30 DIAGNOSIS — F330 Major depressive disorder, recurrent, mild: Secondary | ICD-10-CM | POA: Diagnosis not present

## 2023-12-30 DIAGNOSIS — F411 Generalized anxiety disorder: Secondary | ICD-10-CM | POA: Diagnosis not present

## 2023-12-31 DIAGNOSIS — F332 Major depressive disorder, recurrent severe without psychotic features: Secondary | ICD-10-CM | POA: Diagnosis not present

## 2023-12-31 DIAGNOSIS — F4312 Post-traumatic stress disorder, chronic: Secondary | ICD-10-CM | POA: Diagnosis not present

## 2024-01-03 DIAGNOSIS — F332 Major depressive disorder, recurrent severe without psychotic features: Secondary | ICD-10-CM | POA: Diagnosis not present

## 2024-01-03 DIAGNOSIS — F4312 Post-traumatic stress disorder, chronic: Secondary | ICD-10-CM | POA: Diagnosis not present

## 2024-01-11 DIAGNOSIS — F4312 Post-traumatic stress disorder, chronic: Secondary | ICD-10-CM | POA: Diagnosis not present

## 2024-01-11 DIAGNOSIS — F332 Major depressive disorder, recurrent severe without psychotic features: Secondary | ICD-10-CM | POA: Diagnosis not present

## 2024-01-17 DIAGNOSIS — F332 Major depressive disorder, recurrent severe without psychotic features: Secondary | ICD-10-CM | POA: Diagnosis not present

## 2024-01-17 DIAGNOSIS — F4312 Post-traumatic stress disorder, chronic: Secondary | ICD-10-CM | POA: Diagnosis not present

## 2024-01-24 DIAGNOSIS — F4312 Post-traumatic stress disorder, chronic: Secondary | ICD-10-CM | POA: Diagnosis not present

## 2024-01-24 DIAGNOSIS — F332 Major depressive disorder, recurrent severe without psychotic features: Secondary | ICD-10-CM | POA: Diagnosis not present

## 2024-02-11 DIAGNOSIS — F4312 Post-traumatic stress disorder, chronic: Secondary | ICD-10-CM | POA: Diagnosis not present

## 2024-02-11 DIAGNOSIS — F332 Major depressive disorder, recurrent severe without psychotic features: Secondary | ICD-10-CM | POA: Diagnosis not present

## 2024-02-21 DIAGNOSIS — F332 Major depressive disorder, recurrent severe without psychotic features: Secondary | ICD-10-CM | POA: Diagnosis not present

## 2024-02-21 DIAGNOSIS — F4312 Post-traumatic stress disorder, chronic: Secondary | ICD-10-CM | POA: Diagnosis not present

## 2024-02-28 DIAGNOSIS — F332 Major depressive disorder, recurrent severe without psychotic features: Secondary | ICD-10-CM | POA: Diagnosis not present

## 2024-02-28 DIAGNOSIS — F4312 Post-traumatic stress disorder, chronic: Secondary | ICD-10-CM | POA: Diagnosis not present

## 2024-03-06 DIAGNOSIS — F332 Major depressive disorder, recurrent severe without psychotic features: Secondary | ICD-10-CM | POA: Diagnosis not present

## 2024-03-06 DIAGNOSIS — F4312 Post-traumatic stress disorder, chronic: Secondary | ICD-10-CM | POA: Diagnosis not present

## 2024-04-03 DIAGNOSIS — F4312 Post-traumatic stress disorder, chronic: Secondary | ICD-10-CM | POA: Diagnosis not present

## 2024-04-03 DIAGNOSIS — F332 Major depressive disorder, recurrent severe without psychotic features: Secondary | ICD-10-CM | POA: Diagnosis not present

## 2024-04-10 DIAGNOSIS — F4312 Post-traumatic stress disorder, chronic: Secondary | ICD-10-CM | POA: Diagnosis not present

## 2024-04-10 DIAGNOSIS — F332 Major depressive disorder, recurrent severe without psychotic features: Secondary | ICD-10-CM | POA: Diagnosis not present

## 2024-04-24 DIAGNOSIS — F332 Major depressive disorder, recurrent severe without psychotic features: Secondary | ICD-10-CM | POA: Diagnosis not present

## 2024-04-24 DIAGNOSIS — F4312 Post-traumatic stress disorder, chronic: Secondary | ICD-10-CM | POA: Diagnosis not present

## 2024-04-27 DIAGNOSIS — F411 Generalized anxiety disorder: Secondary | ICD-10-CM | POA: Diagnosis not present

## 2024-04-27 DIAGNOSIS — F330 Major depressive disorder, recurrent, mild: Secondary | ICD-10-CM | POA: Diagnosis not present

## 2024-05-01 DIAGNOSIS — F332 Major depressive disorder, recurrent severe without psychotic features: Secondary | ICD-10-CM | POA: Diagnosis not present

## 2024-05-01 DIAGNOSIS — F4312 Post-traumatic stress disorder, chronic: Secondary | ICD-10-CM | POA: Diagnosis not present

## 2024-05-08 DIAGNOSIS — F332 Major depressive disorder, recurrent severe without psychotic features: Secondary | ICD-10-CM | POA: Diagnosis not present

## 2024-05-08 DIAGNOSIS — F4312 Post-traumatic stress disorder, chronic: Secondary | ICD-10-CM | POA: Diagnosis not present

## 2024-05-22 DIAGNOSIS — F4312 Post-traumatic stress disorder, chronic: Secondary | ICD-10-CM | POA: Diagnosis not present

## 2024-05-22 DIAGNOSIS — F332 Major depressive disorder, recurrent severe without psychotic features: Secondary | ICD-10-CM | POA: Diagnosis not present

## 2024-06-06 DIAGNOSIS — F4312 Post-traumatic stress disorder, chronic: Secondary | ICD-10-CM | POA: Diagnosis not present

## 2024-06-15 DIAGNOSIS — F4312 Post-traumatic stress disorder, chronic: Secondary | ICD-10-CM | POA: Diagnosis not present

## 2024-06-20 DIAGNOSIS — F4312 Post-traumatic stress disorder, chronic: Secondary | ICD-10-CM | POA: Diagnosis not present

## 2024-06-27 DIAGNOSIS — F4312 Post-traumatic stress disorder, chronic: Secondary | ICD-10-CM | POA: Diagnosis not present

## 2024-07-04 DIAGNOSIS — F4312 Post-traumatic stress disorder, chronic: Secondary | ICD-10-CM | POA: Diagnosis not present

## 2024-07-06 DIAGNOSIS — F4312 Post-traumatic stress disorder, chronic: Secondary | ICD-10-CM | POA: Diagnosis not present

## 2024-07-11 DIAGNOSIS — F4312 Post-traumatic stress disorder, chronic: Secondary | ICD-10-CM | POA: Diagnosis not present

## 2024-07-13 DIAGNOSIS — F4312 Post-traumatic stress disorder, chronic: Secondary | ICD-10-CM | POA: Diagnosis not present

## 2024-07-18 DIAGNOSIS — F4312 Post-traumatic stress disorder, chronic: Secondary | ICD-10-CM | POA: Diagnosis not present

## 2024-07-20 DIAGNOSIS — F4312 Post-traumatic stress disorder, chronic: Secondary | ICD-10-CM | POA: Diagnosis not present

## 2024-07-25 DIAGNOSIS — F4312 Post-traumatic stress disorder, chronic: Secondary | ICD-10-CM | POA: Diagnosis not present

## 2024-08-01 DIAGNOSIS — F4312 Post-traumatic stress disorder, chronic: Secondary | ICD-10-CM | POA: Diagnosis not present

## 2024-08-08 DIAGNOSIS — F4312 Post-traumatic stress disorder, chronic: Secondary | ICD-10-CM | POA: Diagnosis not present

## 2024-08-15 DIAGNOSIS — F4312 Post-traumatic stress disorder, chronic: Secondary | ICD-10-CM | POA: Diagnosis not present

## 2024-08-22 DIAGNOSIS — F4312 Post-traumatic stress disorder, chronic: Secondary | ICD-10-CM | POA: Diagnosis not present

## 2024-08-29 DIAGNOSIS — F4312 Post-traumatic stress disorder, chronic: Secondary | ICD-10-CM | POA: Diagnosis not present

## 2024-08-31 DIAGNOSIS — F4312 Post-traumatic stress disorder, chronic: Secondary | ICD-10-CM | POA: Diagnosis not present
